# Patient Record
Sex: Male | Born: 1960 | Race: White | Hispanic: No | Marital: Single | Smoking: Former smoker
Health system: Southern US, Community
[De-identification: ages and names within clinical notes are randomized; demographics above are authoritative.]

## PROBLEM LIST (undated history)

## (undated) DIAGNOSIS — K7460 Unspecified cirrhosis of liver: Secondary | ICD-10-CM

## (undated) DIAGNOSIS — R200 Anesthesia of skin: Secondary | ICD-10-CM

## (undated) DIAGNOSIS — I471 Supraventricular tachycardia: Secondary | ICD-10-CM

## (undated) DIAGNOSIS — D638 Anemia in other chronic diseases classified elsewhere: Secondary | ICD-10-CM

## (undated) DIAGNOSIS — K219 Gastro-esophageal reflux disease without esophagitis: Secondary | ICD-10-CM

## (undated) DIAGNOSIS — E7800 Pure hypercholesterolemia, unspecified: Secondary | ICD-10-CM

## (undated) DIAGNOSIS — K859 Acute pancreatitis without necrosis or infection, unspecified: Secondary | ICD-10-CM

## (undated) DIAGNOSIS — D696 Thrombocytopenia, unspecified: Secondary | ICD-10-CM

## (undated) DIAGNOSIS — I10 Essential (primary) hypertension: Secondary | ICD-10-CM

## (undated) DIAGNOSIS — K7290 Hepatic failure, unspecified without coma: Secondary | ICD-10-CM

## (undated) DIAGNOSIS — F1910 Other psychoactive substance abuse, uncomplicated: Secondary | ICD-10-CM

## (undated) DIAGNOSIS — I4719 Other supraventricular tachycardia: Secondary | ICD-10-CM

## (undated) DIAGNOSIS — F419 Anxiety disorder, unspecified: Secondary | ICD-10-CM

## (undated) HISTORY — PX: NASAL RECONSTRUCTION WITH SEPTAL REPAIR: SHX5665

---

## 2012-02-13 ENCOUNTER — Emergency Department: Payer: Self-pay | Admitting: Emergency Medicine

## 2013-10-01 ENCOUNTER — Ambulatory Visit: Payer: Self-pay | Admitting: Gastroenterology

## 2013-10-02 LAB — PATHOLOGY REPORT

## 2015-04-02 ENCOUNTER — Encounter: Payer: Self-pay | Admitting: *Deleted

## 2015-04-02 ENCOUNTER — Emergency Department: Payer: No Typology Code available for payment source

## 2015-04-02 ENCOUNTER — Emergency Department
Admission: EM | Admit: 2015-04-02 | Discharge: 2015-04-02 | Disposition: A | Discharge status: Home / Self Care | Payer: No Typology Code available for payment source | Attending: Emergency Medicine | Admitting: Emergency Medicine

## 2015-04-02 DIAGNOSIS — R42 Dizziness and giddiness: Secondary | ICD-10-CM | POA: Diagnosis not present

## 2015-04-02 DIAGNOSIS — F419 Anxiety disorder, unspecified: Secondary | ICD-10-CM | POA: Diagnosis not present

## 2015-04-02 DIAGNOSIS — J209 Acute bronchitis, unspecified: Secondary | ICD-10-CM | POA: Diagnosis not present

## 2015-04-02 DIAGNOSIS — R05 Cough: Secondary | ICD-10-CM | POA: Diagnosis present

## 2015-04-02 DIAGNOSIS — I10 Essential (primary) hypertension: Secondary | ICD-10-CM | POA: Diagnosis not present

## 2015-04-02 DIAGNOSIS — J40 Bronchitis, not specified as acute or chronic: Secondary | ICD-10-CM

## 2015-04-02 HISTORY — DX: Anxiety disorder, unspecified: F41.9

## 2015-04-02 HISTORY — DX: Essential (primary) hypertension: I10

## 2015-04-02 HISTORY — DX: Pure hypercholesterolemia, unspecified: E78.00

## 2015-04-02 LAB — COMPREHENSIVE METABOLIC PANEL
ALT: 137 U/L — ABNORMAL HIGH (ref 17–63)
AST: 224 U/L — ABNORMAL HIGH (ref 15–41)
Albumin: 4.4 g/dL (ref 3.5–5.0)
Alkaline Phosphatase: 128 U/L — ABNORMAL HIGH (ref 38–126)
Anion gap: 9 (ref 5–15)
BILIRUBIN TOTAL: 1 mg/dL (ref 0.3–1.2)
BUN: 20 mg/dL (ref 6–20)
CO2: 24 mmol/L (ref 22–32)
CREATININE: 1.81 mg/dL — AB (ref 0.61–1.24)
Calcium: 9.2 mg/dL (ref 8.9–10.3)
Chloride: 97 mmol/L — ABNORMAL LOW (ref 101–111)
GFR calc non Af Amer: 40 mL/min — ABNORMAL LOW (ref >60)
GFR, EST AFRICAN AMERICAN: 47 mL/min — AB (ref >60)
GLUCOSE: 113 mg/dL — AB (ref 65–99)
POTASSIUM: 3.8 mmol/L (ref 3.5–5.1)
Sodium: 130 mmol/L — ABNORMAL LOW (ref 135–145)
TOTAL PROTEIN: 8.9 g/dL — AB (ref 6.5–8.1)

## 2015-04-02 LAB — CBC WITH DIFFERENTIAL/PLATELET
Basophils Absolute: 0.1 10*3/uL (ref 0–0.1)
Basophils Relative: 1 %
EOS PCT: 3 %
Eosinophils Absolute: 0.3 10*3/uL (ref 0–0.7)
HEMATOCRIT: 44.9 % (ref 40.0–52.0)
Hemoglobin: 15.2 g/dL (ref 13.0–18.0)
LYMPHS PCT: 17 %
Lymphs Abs: 1.6 10*3/uL (ref 1.0–3.6)
MCH: 34.3 pg — AB (ref 26.0–34.0)
MCHC: 33.9 g/dL (ref 32.0–36.0)
MCV: 101.2 fL — AB (ref 80.0–100.0)
Monocytes Absolute: 0.8 10*3/uL (ref 0.2–1.0)
Monocytes Relative: 9 %
NEUTROS ABS: 6.7 10*3/uL — AB (ref 1.4–6.5)
Neutrophils Relative %: 70 %
PLATELETS: 268 10*3/uL (ref 150–440)
RBC: 4.44 MIL/uL (ref 4.40–5.90)
RDW: 13.3 % (ref 11.5–14.5)
WBC: 9.5 10*3/uL (ref 3.8–10.6)

## 2015-04-02 LAB — TROPONIN I: Troponin I: 0.05 ng/mL — ABNORMAL HIGH (ref <0.031)

## 2015-04-02 MED ORDER — PREDNISONE 50 MG PO TABS: 50.0000 mg | ORAL_TABLET | Freq: Every day | ORAL | Status: DC

## 2015-04-02 MED ORDER — AZITHROMYCIN 250 MG PO TABS: ORAL_TABLET | ORAL | Status: AC

## 2015-04-02 MED ORDER — SODIUM CHLORIDE 0.9 % IV SOLN
1000.0000 mL | Freq: Once | INTRAVENOUS | Status: AC
  Administered 2015-04-02: 1000 mL via INTRAVENOUS

## 2015-04-02 NOTE — ED Provider Notes (Signed)
[facility] Emergency Department Provider Note  ____________________________________________  Time seen: 8 PM  I have reviewed the triage vital signs and the nursing notes.   HISTORY  Chief Complaint Cough for 4 months and intermittent dizziness   HPI Cristine PolioMichael J Kishbaugh is a 54 y.o. male who presents with complaints of cough for the last 4 months. He is unsure what is causing it. He does not smoke, he does smoke in the past. No history of COPD. He hasn't had fevers or chills. His cough is nonproductive. He denies chest pain. No pleurisy No recent travel. No calf pain or swelling. No history of DVT.No hemoptysis. He has had intermittent episodes of dizziness as well but is usually related to standing up quickly in the morning and occasionally after a hard bout of coughing. Nurse's note mentions exertional shortness of breath but he denies this to me. No recent incarceration.     Past Medical History  Diagnosis Date  . Hypertension   . Hypercholesteremia   . Anxiety     There are no active problems to display for this patient.   Past Surgical History  Procedure Laterality Date  . Nasal reconstruction with septal repair      No current outpatient prescriptions on file.  Allergies Review of patient's allergies indicates no known allergies.  No family history on file.  Social History Social History  Substance Use Topics  . Smoking status: Never Smoker   . Smokeless tobacco: None  . Alcohol Use: Yes    Review of Systems  Constitutional: Negative for fever. Negative for chills Eyes: Negative for visual changes. ENT: Negative for sore throat Cardiovascular: Negative for chest pain. Respiratory: Negative for shortness of breath. As above for coughing Gastrointestinal: Negative for abdominal pain  Musculoskeletal: Negative for back pain. Skin: Negative for rash. Neurological: Negative for headaches or focal weakness Psychiatric: Positive for  anxiety    ____________________________________________   PHYSICAL EXAM:  VITAL SIGNS: ED Triage Vitals  Enc Vitals Group     BP 04/02/15 1833 101/66 mmHg     Pulse Rate 04/02/15 1833 109     Resp 04/02/15 1833 20     Temp 04/02/15 1833 98.4 F (36.9 C)     Temp Source 04/02/15 1833 Oral     SpO2 04/02/15 1833 99 %     Weight 04/02/15 1833 200 lb (90.719 kg)     Height 04/02/15 1833 6' (1.829 m)     Head Cir --      Peak Flow --      Pain Score 04/02/15 1835 9     Pain Loc --      Pain Edu? --      Excl. in GC? --      Constitutional: Alert and oriented. Well appearing and in no distress. Eyes: Conjunctivae are normal.  ENT   Head: Normocephalic and atraumatic.   Mouth/Throat: Mucous membranes are moist. Cardiovascular: Normal rate, regular rhythm. Normal and symmetric distal pulses are present in all extremities. No murmurs, rubs, or gallops. Respiratory: Normal respiratory effort without tachypnea nor retractions. Scattered wheezes Gastrointestinal: Soft and non-tender in all quadrants. No distention. There is no CVA tenderness. Genitourinary: deferred Musculoskeletal: Nontender with normal range of motion in all extremities. No lower extremity tenderness nor edema. Neurologic:  Normal speech and language. No gross focal neurologic deficits are appreciated. Skin:  Skin is warm, dry and intact. No rash noted. Psychiatric: Mood and affect are normal. Patient exhibits appropriate insight and  judgment.  ____________________________________________    LABS (pertinent positives/negatives)  Labs Reviewed  TROPONIN I - Abnormal; Notable for the following:    Troponin I 0.05 (*)    All other components within normal limits  CBC WITH DIFFERENTIAL/PLATELET - Abnormal; Notable for the following:    MCV 101.2 (*)    MCH 34.3 (*)    Neutro Abs 6.7 (*)    All other components within normal limits  COMPREHENSIVE METABOLIC PANEL - Abnormal; Notable for the following:     Sodium 130 (*)    Chloride 97 (*)    Glucose, Bld 113 (*)    Creatinine, Ser 1.81 (*)    Total Protein 8.9 (*)    AST 224 (*)    ALT 137 (*)    Alkaline Phosphatase 128 (*)    GFR calc non Af Amer 40 (*)    GFR calc Af Amer 47 (*)    All other components within normal limits  TROPONIN I    ____________________________________________   EKG  ED ECG REPORT I, Jene Every, the attending physician, personally viewed and interpreted this ECG.  Date: 04/02/2015 EKG Time: 6:44 PM Rate: 99 Rhythm: normal sinus rhythm QRS Axis: normal Intervals: normal ST/T Wave abnormalities: normal Conduction Disutrbances: none Narrative Interpretation: unremarkable   ____________________________________________    RADIOLOGY I have personally reviewed any xrays that were ordered on this patient: Chest x-ray unremarkable  ____________________________________________   PROCEDURES  Procedure(s) performed: none  Critical Care performed: none  ____________________________________________   INITIAL IMPRESSION / ASSESSMENT AND PLAN / ED COURSE  Pertinent labs & imaging results that were available during my care of the patient were reviewed by me and considered in my medical decision making (see chart for details).  Patient well-appearing with symptoms over the last 4 months. Chronic cough suspicious for bronchitis given scattered wheezes and history of present illness. Pulmonary embolus considered although no pleurisy no hemoptysis no history of DVTs, no calf pain, no chest pain so felt to be extremely unlikely. EKG completely normal. Troponin rechecked and normalized. History of present illness not consistent with ACS. No dizziness in the last week   I'll discharge with a prednisone Z-Pak for bronchitis. I have asked patient to follow-up with his PCP this week for further evaluation. Return precautions discussed ____________________________________________   FINAL CLINICAL  IMPRESSION(S) / ED DIAGNOSES  Final diagnoses:  Bronchitis     Jene Every, MD 04/02/15 2139

## 2015-04-02 NOTE — ED Notes (Addendum)
Pt reports non-productive cough for "weeks". Pt also reports taking OTC cold mediations w/o relief.    04/02/15 2020  Respiratory  Respiratory (WDL) X  Bilateral Breath Sounds Clear  O2 Device Room Air

## 2015-04-02 NOTE — ED Notes (Signed)
MD at bedside. 

## 2015-04-02 NOTE — Discharge Instructions (Signed)

## 2015-04-02 NOTE — ED Notes (Addendum)
Pt c/o headache and pain across shoulder blades and in chest with coughing. Pt reports non-productive cough with dizziness.    04/02/15 2020  Musculoskeletal  Musculoskeletal (WDL) X

## 2015-04-02 NOTE — ED Notes (Signed)
Past few weeks, has had dry cough that causes him some dizziness, anytype of activity makes hime feel like he is going to pass out

## 2015-07-01 ENCOUNTER — Emergency Department
Admission: EM | Admit: 2015-07-01 | Discharge: 2015-07-01 | Disposition: A | Discharge status: Home / Self Care | Payer: Self-pay | Attending: Emergency Medicine | Admitting: Emergency Medicine

## 2015-07-01 ENCOUNTER — Encounter: Payer: Self-pay | Admitting: Emergency Medicine

## 2015-07-01 ENCOUNTER — Emergency Department: Payer: Self-pay

## 2015-07-01 DIAGNOSIS — Z7952 Long term (current) use of systemic steroids: Secondary | ICD-10-CM | POA: Insufficient documentation

## 2015-07-01 DIAGNOSIS — E7800 Pure hypercholesterolemia, unspecified: Secondary | ICD-10-CM | POA: Insufficient documentation

## 2015-07-01 DIAGNOSIS — J069 Acute upper respiratory infection, unspecified: Secondary | ICD-10-CM | POA: Insufficient documentation

## 2015-07-01 DIAGNOSIS — I10 Essential (primary) hypertension: Secondary | ICD-10-CM | POA: Insufficient documentation

## 2015-07-01 LAB — CBC WITH DIFFERENTIAL/PLATELET
BASOS ABS: 0.1 10*3/uL (ref 0–0.1)
Basophils Relative: 1 %
Eosinophils Absolute: 0.1 10*3/uL (ref 0–0.7)
Eosinophils Relative: 0 %
HEMATOCRIT: 34.6 % — AB (ref 40.0–52.0)
Hemoglobin: 11.9 g/dL — ABNORMAL LOW (ref 13.0–18.0)
LYMPHS ABS: 1.6 10*3/uL (ref 1.0–3.6)
LYMPHS PCT: 12 %
MCH: 35.5 pg — AB (ref 26.0–34.0)
MCHC: 34.5 g/dL (ref 32.0–36.0)
MCV: 102.9 fL — AB (ref 80.0–100.0)
MONO ABS: 0.8 10*3/uL (ref 0.2–1.0)
MONOS PCT: 6 %
NEUTROS ABS: 10.6 10*3/uL — AB (ref 1.4–6.5)
Neutrophils Relative %: 81 %
Platelets: 141 10*3/uL — ABNORMAL LOW (ref 150–440)
RBC: 3.36 MIL/uL — ABNORMAL LOW (ref 4.40–5.90)
RDW: 13.9 % (ref 11.5–14.5)
WBC: 13.2 10*3/uL — ABNORMAL HIGH (ref 3.8–10.6)

## 2015-07-01 LAB — BASIC METABOLIC PANEL
ANION GAP: 8 (ref 5–15)
BUN: 9 mg/dL (ref 6–20)
CHLORIDE: 104 mmol/L (ref 101–111)
CO2: 23 mmol/L (ref 22–32)
Calcium: 7.7 mg/dL — ABNORMAL LOW (ref 8.9–10.3)
Creatinine, Ser: 0.84 mg/dL (ref 0.61–1.24)
GFR calc Af Amer: >60 mL/min (ref >60)
GLUCOSE: 105 mg/dL — AB (ref 65–99)
POTASSIUM: 3.3 mmol/L — AB (ref 3.5–5.1)
Sodium: 135 mmol/L (ref 135–145)

## 2015-07-01 MED ORDER — DIPHENOXYLATE-ATROPINE 2.5-0.025 MG PO TABS: 1.0000 | ORAL_TABLET | Freq: Four times a day (QID) | ORAL | Status: DC | PRN

## 2015-07-01 MED ORDER — GUAIFENESIN-CODEINE 100-10 MG/5ML PO SOLN: 10.0000 mL | ORAL | Status: DC | PRN

## 2015-07-01 MED ORDER — AZITHROMYCIN 250 MG PO TABS: ORAL_TABLET | ORAL | Status: DC

## 2015-07-01 MED ORDER — ONDANSETRON HCL 4 MG PO TABS: 4.0000 mg | ORAL_TABLET | Freq: Every day | ORAL | Status: DC | PRN

## 2015-07-01 NOTE — ED Notes (Signed)
Pt to ed with c/o cough, body aches, congestion and nasal drainage, mild diarrhea and vomiting after coughing for several days.  PA at bedside at this time. Pt states poor intake since Sunday.  Pt in no acute respiratory distress at this time.

## 2015-07-01 NOTE — ED Notes (Signed)
Pt reports cough/congestion, "ear popping",N/V/D.

## 2015-07-01 NOTE — ED Provider Notes (Signed)
[facility] Emergency Department Provider Note  ____________________________________________  Time seen: Approximately 2:13 PM  I have reviewed the triage vital signs and the nursing notes.   HISTORY  Chief Complaint Influenza    HPI Ryan Howe is a 55 y.o. male since for evaluation cough congestion and body aches nasal drainage [place] vomiting after coughing for several days. Patient states that he's had a fever but nonetheless couple days. She reports some "ear popping" in his right ear. Patient reports symptoms gradual in onset currently afebrile. Has not vomited or had diarrhea since arriving.   Past Medical History  Diagnosis Date  . Hypertension   . Hypercholesteremia   . Anxiety     There are no active problems to display for this patient.   Past Surgical History  Procedure Laterality Date  . Nasal reconstruction with septal repair      Current Outpatient Rx  Name  Route  Sig  Dispense  Refill  . azithromycin (ZITHROMAX Z-PAK) 250 MG tablet      Take 2 tablets (500 mg) on  Day 1,  followed by 1 tablet (250 mg) once daily on Days 2 through 5.   6 each   0   . diphenoxylate-atropine (LOMOTIL) 2.5-0.025 MG tablet   Oral   Take 1 tablet by mouth 4 (four) times daily as needed for diarrhea or loose stools.   12 tablet   0   . guaiFENesin-codeine 100-10 MG/5ML syrup   Oral   Take 10 mLs by mouth every 4 (four) hours as needed for cough.   180 mL   0   . ondansetron (ZOFRAN) 4 MG tablet   Oral   Take 1 tablet (4 mg total) by mouth daily as needed for nausea or vomiting.   12 tablet   0   . predniSONE (DELTASONE) 50 MG tablet   Oral   Take 1 tablet (50 mg total) by mouth daily with breakfast.   5 tablet   0     Allergies Review of patient's allergies indicates no known allergies.  No family history on file.  Social History Social History  Substance Use Topics  . Smoking status: Never Smoker   . Smokeless  tobacco: None  . Alcohol Use: Yes    Review of Systems Constitutional: Occasional fever/chills Eyes: No visual changes. ENT: No sore throat. Cardiovascular: Denies chest pain. Respiratory: Denies shortness of breath. Positive for coughing  Gastrointestinal: Minimal abdominal pain.  Positive for nausea, vomiting, diarrhea..  No constipation. Genitourinary: Negative for dysuria. Musculoskeletal: Negative for back pain. Skin: Negative for rash. Neurological: Negative for headaches, focal weakness or numbness.  10-point ROS otherwise negative.  ____________________________________________   PHYSICAL EXAM:  VITAL SIGNS: ED Triage Vitals  Enc Vitals Group     BP 07/01/15 1357 155/92 mmHg     Pulse Rate 07/01/15 1356 92     Resp 07/01/15 1356 16     Temp 07/01/15 1356 98.2 F (36.8 C)     Temp Source 07/01/15 1356 Oral     SpO2 07/01/15 1356 99 %     Weight 07/01/15 1356 200 lb (90.719 kg)     Height 07/01/15 1356 6' (1.829 m)     Head Cir --      Peak Flow --      Pain Score 07/01/15 1356 8     Pain Loc --      Pain Edu? --      Excl. in GC? --  Constitutional: Alert and oriented. Well appearing and in no acute distress. Eyes: Conjunctivae are normal. PERRL. EOMI. Head: Atraumatic. TM with minimal cerumen noted in the right ear Nose: No congestion/rhinnorhea. Mouth/Throat: Mucous membranes are moist.  Oropharynx non-erythematous. Neck: No stridor.   Cardiovascular: Normal rate, regular rhythm. Grossly normal heart sounds.  Good peripheral circulation. Respiratory: Normal respiratory effort.  No retractions. Lungs CTAB. Gastrointestinal: Soft and nontender. No distention. No abdominal bruits. No CVA tenderness. Musculoskeletal: No lower extremity tenderness nor edema.  No joint effusions. Neurologic:  Normal speech and language. No gross focal neurologic deficits are appreciated. No gait instability. Skin:  Skin is warm, dry and intact. No rash noted. Psychiatric:  Mood and affect are normal. Speech and behavior are normal.  ____________________________________________   LABS (all labs ordered are listed, but only abnormal results are displayed)  Labs Reviewed  BASIC METABOLIC PANEL - Abnormal; Notable for the following:    Potassium 3.3 (*)    Glucose, Bld 105 (*)    Calcium 7.7 (*)    All other components within normal limits  CBC WITH DIFFERENTIAL/PLATELET - Abnormal; Notable for the following:    WBC 13.2 (*)    RBC 3.36 (*)    Hemoglobin 11.9 (*)    HCT 34.6 (*)    MCV 102.9 (*)    MCH 35.5 (*)    Platelets 141 (*)    Neutro Abs 10.6 (*)    All other components within normal limits     RADIOLOGY  Negative for acute abdomen and chest x-ray negative. ____________________________________________   PROCEDURES  Procedure(s) performed: None  Critical Care performed: No  ____________________________________________   INITIAL IMPRESSION / ASSESSMENT AND PLAN / ED COURSE  Pertinent labs & imaging results that were available during my care of the patient were reviewed by me and considered in my medical decision making (see chart for details).  Acute URI. Rx given for Zithromax and Robitussin-AC. Work excuse given. Patient to follow-up with PCP or return here for any worsening symptomology. ____________________________________________   FINAL CLINICAL IMPRESSION(S) / ED DIAGNOSES  Final diagnoses:  URI, acute     This chart was dictated using voice recognition software/Dragon. Despite best efforts to proofread, errors can occur which can change the meaning. Any change was purely unintentional.   Evangeline Dakin, PA-C 07/01/15 1609  Rockne Menghini, MD 07/02/15 [phone]

## 2015-07-01 NOTE — Discharge Instructions (Signed)
Upper Respiratory Infection, Adult Most upper respiratory infections (URIs) are a viral infection of the air passages leading to the lungs. A URI affects the nose, throat, and upper air passages. The most common type of URI is nasopharyngitis and is typically referred to as "the common cold." URIs run their course and usually go away on their own. Most of the time, a URI does not require medical attention, but sometimes a bacterial infection in the upper airways can follow a viral infection. This is called a secondary infection. Sinus and middle ear infections are common types of secondary upper respiratory infections. Bacterial pneumonia can also complicate a URI. A URI can worsen asthma and chronic obstructive pulmonary disease (COPD). Sometimes, these complications can require emergency medical care and may be life threatening.  CAUSES Almost all URIs are caused by viruses. A virus is a type of germ and can spread from one person to another.  RISKS FACTORS You may be at risk for a URI if:   You smoke.   You have chronic heart or lung disease.  You have a weakened defense (immune) system.   You are very young or very old.   You have nasal allergies or asthma.  You work in crowded or poorly ventilated areas.  You work in health care facilities or schools. SIGNS AND SYMPTOMS  Symptoms typically develop 2-3 days after you come in contact with a cold virus. Most viral URIs last 7-10 days. However, viral URIs from the influenza virus (flu virus) can last 14-18 days and are typically more severe. Symptoms may include:   Runny or stuffy (congested) nose.   Sneezing.   Cough.   Sore throat.   Headache.   Fatigue.   Fever.   Loss of appetite.   Pain in your forehead, behind your eyes, and over your cheekbones (sinus pain).  Muscle aches.  DIAGNOSIS  Your health care provider may diagnose a URI by:  Physical exam.  Tests to check that your symptoms are not due to  another condition such as:  Strep throat.  Sinusitis.  Pneumonia.  Asthma. TREATMENT  A URI goes away on its own with time. It cannot be cured with medicines, but medicines may be prescribed or recommended to relieve symptoms. Medicines may help:  Reduce your fever.  Reduce your cough.  Relieve nasal congestion. HOME CARE INSTRUCTIONS   Take medicines only as directed by your health care provider.   Gargle warm saltwater or take cough drops to comfort your throat as directed by your health care provider.  Use a warm mist humidifier or inhale steam from a shower to increase air moisture. This may make it easier to breathe.  Drink enough fluid to keep your urine clear or pale yellow.   Eat soups and other clear broths and maintain good nutrition.   Rest as needed.   Return to work when your temperature has returned to normal or as your health care provider advises. You may need to stay home longer to avoid infecting others. You can also use a face mask and careful hand washing to prevent spread of the virus.  Increase the usage of your inhaler if you have asthma.   Do not use any tobacco products, including cigarettes, chewing tobacco, or electronic cigarettes. If you need help quitting, ask your health care provider. PREVENTION  The best way to protect yourself from getting a cold is to practice good hygiene.   Avoid oral or hand contact with people with cold   symptoms.   Wash your hands often if contact occurs.  There is no clear evidence that vitamin C, vitamin E, echinacea, or exercise reduces the chance of developing a cold. However, it is always recommended to get plenty of rest, exercise, and practice good nutrition.  SEEK MEDICAL CARE IF:   You are getting worse rather than better.   Your symptoms are not controlled by medicine.   You have chills.  You have worsening shortness of breath.  You have brown or red mucus.  You have yellow or brown nasal  discharge.  You have pain in your face, especially when you bend forward.  You have a fever.  You have swollen neck glands.  You have pain while swallowing.  You have white areas in the back of your throat. SEEK IMMEDIATE MEDICAL CARE IF:   You have severe or persistent:  Headache.  Ear pain.  Sinus pain.  Chest pain.  You have chronic lung disease and any of the following:  Wheezing.  Prolonged cough.  Coughing up blood.  A change in your usual mucus.  You have a stiff neck.  You have changes in your:  Vision.  Hearing.  Thinking.  Mood. MAKE SURE YOU:   Understand these instructions.  Will watch your condition.  Will get help right away if you are not doing well or get worse.   This information is not intended to replace advice given to you by your health care provider. Make sure you discuss any questions you have with your health care provider.   Document Released: 09/15/2000 Document Revised: 08/06/2014 Document Reviewed: 06/27/2013 [employer] Interactive Patient Education 2016 [employer]  

## 2015-07-30 ENCOUNTER — Ambulatory Visit: Payer: No Typology Code available for payment source

## 2015-12-02 DIAGNOSIS — K625 Hemorrhage of anus and rectum: Secondary | ICD-10-CM | POA: Insufficient documentation

## 2015-12-22 ENCOUNTER — Emergency Department: Payer: Self-pay

## 2015-12-22 ENCOUNTER — Encounter: Payer: Self-pay | Admitting: Emergency Medicine

## 2015-12-22 ENCOUNTER — Inpatient Hospital Stay
Admission: EM | Admit: 2015-12-22 | Discharge: 2015-12-26 | DRG: 433 | Disposition: A | Discharge status: Home / Self Care | Payer: Self-pay | Attending: Internal Medicine | Admitting: Internal Medicine

## 2015-12-22 DIAGNOSIS — E860 Dehydration: Secondary | ICD-10-CM | POA: Diagnosis present

## 2015-12-22 DIAGNOSIS — K7030 Alcoholic cirrhosis of liver without ascites: Secondary | ICD-10-CM | POA: Diagnosis present

## 2015-12-22 DIAGNOSIS — K7682 Hepatic encephalopathy: Secondary | ICD-10-CM

## 2015-12-22 DIAGNOSIS — R748 Abnormal levels of other serum enzymes: Secondary | ICD-10-CM | POA: Diagnosis present

## 2015-12-22 DIAGNOSIS — D638 Anemia in other chronic diseases classified elsewhere: Secondary | ICD-10-CM | POA: Diagnosis present

## 2015-12-22 DIAGNOSIS — K759 Inflammatory liver disease, unspecified: Secondary | ICD-10-CM

## 2015-12-22 DIAGNOSIS — K7010 Alcoholic hepatitis without ascites: Principal | ICD-10-CM | POA: Diagnosis present

## 2015-12-22 DIAGNOSIS — N179 Acute kidney failure, unspecified: Secondary | ICD-10-CM | POA: Diagnosis present

## 2015-12-22 DIAGNOSIS — K760 Fatty (change of) liver, not elsewhere classified: Secondary | ICD-10-CM | POA: Diagnosis present

## 2015-12-22 DIAGNOSIS — R2681 Unsteadiness on feet: Secondary | ICD-10-CM

## 2015-12-22 DIAGNOSIS — R17 Unspecified jaundice: Secondary | ICD-10-CM

## 2015-12-22 DIAGNOSIS — Z8249 Family history of ischemic heart disease and other diseases of the circulatory system: Secondary | ICD-10-CM

## 2015-12-22 DIAGNOSIS — Z9889 Other specified postprocedural states: Secondary | ICD-10-CM

## 2015-12-22 DIAGNOSIS — F10239 Alcohol dependence with withdrawal, unspecified: Secondary | ICD-10-CM | POA: Diagnosis present

## 2015-12-22 DIAGNOSIS — I10 Essential (primary) hypertension: Secondary | ICD-10-CM | POA: Diagnosis present

## 2015-12-22 DIAGNOSIS — K7200 Acute and subacute hepatic failure without coma: Secondary | ICD-10-CM | POA: Diagnosis present

## 2015-12-22 DIAGNOSIS — Z79899 Other long term (current) drug therapy: Secondary | ICD-10-CM

## 2015-12-22 DIAGNOSIS — D6959 Other secondary thrombocytopenia: Secondary | ICD-10-CM | POA: Diagnosis present

## 2015-12-22 DIAGNOSIS — R569 Unspecified convulsions: Secondary | ICD-10-CM | POA: Diagnosis present

## 2015-12-22 DIAGNOSIS — K7290 Hepatic failure, unspecified without coma: Secondary | ICD-10-CM | POA: Diagnosis present

## 2015-12-22 DIAGNOSIS — E785 Hyperlipidemia, unspecified: Secondary | ICD-10-CM | POA: Diagnosis present

## 2015-12-22 DIAGNOSIS — D689 Coagulation defect, unspecified: Secondary | ICD-10-CM | POA: Diagnosis present

## 2015-12-22 LAB — COMPREHENSIVE METABOLIC PANEL
ALBUMIN: 4.5 g/dL (ref 3.5–5.0)
ALT: 38 U/L (ref 17–63)
ANION GAP: 17 — AB (ref 5–15)
AST: 143 U/L — ABNORMAL HIGH (ref 15–41)
Alkaline Phosphatase: 223 U/L — ABNORMAL HIGH (ref 38–126)
BUN: 17 mg/dL (ref 6–20)
CHLORIDE: 102 mmol/L (ref 101–111)
CO2: 21 mmol/L — AB (ref 22–32)
CREATININE: 1.51 mg/dL — AB (ref 0.61–1.24)
Calcium: 9.7 mg/dL (ref 8.9–10.3)
GFR calc Af Amer: 58 mL/min — ABNORMAL LOW (ref >60)
GFR calc non Af Amer: 50 mL/min — ABNORMAL LOW (ref >60)
GLUCOSE: 167 mg/dL — AB (ref 65–99)
POTASSIUM: 4.7 mmol/L (ref 3.5–5.1)
SODIUM: 140 mmol/L (ref 135–145)
TOTAL PROTEIN: 9.3 g/dL — AB (ref 6.5–8.1)
Total Bilirubin: 4.5 mg/dL — ABNORMAL HIGH (ref 0.3–1.2)

## 2015-12-22 LAB — AMMONIA: Ammonia: 45 umol/L — ABNORMAL HIGH (ref 9–35)

## 2015-12-22 LAB — CBC WITH DIFFERENTIAL/PLATELET
Basophils Absolute: 0 10*3/uL (ref 0–0.1)
EOS ABS: 0 10*3/uL (ref 0–0.7)
HCT: 31.6 % — ABNORMAL LOW (ref 40.0–52.0)
HEMOGLOBIN: 11 g/dL — AB (ref 13.0–18.0)
Lymphocytes Relative: 9 %
Lymphs Abs: 0.4 10*3/uL — ABNORMAL LOW (ref 1.0–3.6)
MCH: 32.2 pg (ref 26.0–34.0)
MCHC: 34.7 g/dL (ref 32.0–36.0)
MCV: 93 fL (ref 80.0–100.0)
MONO ABS: 0.3 10*3/uL (ref 0.2–1.0)
Neutro Abs: 4.2 10*3/uL (ref 1.4–6.5)
PLATELETS: 67 10*3/uL — AB (ref 150–440)
RBC: 3.4 MIL/uL — ABNORMAL LOW (ref 4.40–5.90)
RDW: 14.7 % — AB (ref 11.5–14.5)
WBC: 5 10*3/uL (ref 3.8–10.6)

## 2015-12-22 LAB — PROTIME-INR
INR: 1.38
Prothrombin Time: 17.1 seconds — ABNORMAL HIGH (ref 11.4–15.2)

## 2015-12-22 LAB — TROPONIN I

## 2015-12-22 MED ORDER — ACETAMINOPHEN 325 MG PO TABS: 650.0000 mg | ORAL_TABLET | Freq: Four times a day (QID) | ORAL | Status: DC | PRN

## 2015-12-22 MED ORDER — ONDANSETRON HCL 4 MG/2ML IJ SOLN: 4.0000 mg | Freq: Four times a day (QID) | INTRAMUSCULAR | Status: DC | PRN

## 2015-12-22 MED ORDER — OXYCODONE HCL 5 MG PO TABS
5.0000 mg | ORAL_TABLET | ORAL | Status: DC | PRN
  Administered 2015-12-26: 06:00:00 5 mg via ORAL
  Filled 2015-12-22: qty 1

## 2015-12-22 MED ORDER — LACTULOSE 10 GM/15ML PO SOLN
30.0000 g | Freq: Once | ORAL | Status: AC
  Administered 2015-12-22: 30 g via ORAL
  Filled 2015-12-22: qty 60

## 2015-12-22 MED ORDER — SODIUM CHLORIDE 0.9% FLUSH
3.0000 mL | Freq: Two times a day (BID) | INTRAVENOUS | Status: DC
  Administered 2015-12-22 – 2015-12-26 (×6): 3 mL via INTRAVENOUS

## 2015-12-22 MED ORDER — LORAZEPAM 2 MG/ML IJ SOLN
1.0000 mg | Freq: Four times a day (QID) | INTRAMUSCULAR | Status: AC | PRN
  Administered 2015-12-23: 1 mg via INTRAVENOUS
  Filled 2015-12-22: qty 1

## 2015-12-22 MED ORDER — FOLIC ACID 1 MG PO TABS
1.0000 mg | ORAL_TABLET | Freq: Once | ORAL | Status: AC
  Administered 2015-12-22: 1 mg via ORAL
  Filled 2015-12-22: qty 1

## 2015-12-22 MED ORDER — METOPROLOL TARTRATE 25 MG PO TABS
25.0000 mg | ORAL_TABLET | Freq: Two times a day (BID) | ORAL | Status: DC
  Administered 2015-12-22 – 2015-12-26 (×8): 25 mg via ORAL
  Filled 2015-12-22 (×8): qty 1

## 2015-12-22 MED ORDER — LORAZEPAM 1 MG PO TABS
1.0000 mg | ORAL_TABLET | Freq: Four times a day (QID) | ORAL | Status: AC | PRN
  Administered 2015-12-22 – 2015-12-25 (×2): 1 mg via ORAL
  Filled 2015-12-22 (×4): qty 1

## 2015-12-22 MED ORDER — FOLIC ACID 1 MG PO TABS
1.0000 mg | ORAL_TABLET | Freq: Every day | ORAL | Status: DC
  Administered 2015-12-23 – 2015-12-26 (×4): 1 mg via ORAL
  Filled 2015-12-22 (×4): qty 1

## 2015-12-22 MED ORDER — ACETAMINOPHEN 650 MG RE SUPP: 650.0000 mg | Freq: Four times a day (QID) | RECTAL | Status: DC | PRN

## 2015-12-22 MED ORDER — RIFAXIMIN 550 MG PO TABS
550.0000 mg | ORAL_TABLET | Freq: Two times a day (BID) | ORAL | Status: DC
  Administered 2015-12-22 – 2015-12-26 (×8): 550 mg via ORAL
  Filled 2015-12-22 (×8): qty 1

## 2015-12-22 MED ORDER — SODIUM CHLORIDE 0.9 % IV BOLUS (SEPSIS)
500.0000 mL | Freq: Once | INTRAVENOUS | Status: AC
  Administered 2015-12-22: 500 mL via INTRAVENOUS

## 2015-12-22 MED ORDER — CITALOPRAM HYDROBROMIDE 20 MG PO TABS
10.0000 mg | ORAL_TABLET | Freq: Every day | ORAL | Status: DC
Start: 2015-12-22 — End: 2015-12-26
  Administered 2015-12-22 – 2015-12-25 (×4): 10 mg via ORAL
  Filled 2015-12-22 (×4): qty 1

## 2015-12-22 MED ORDER — POTASSIUM CHLORIDE IN NACL 20-0.9 MEQ/L-% IV SOLN
INTRAVENOUS | Status: DC
  Administered 2015-12-22: 23:00:00 via INTRAVENOUS
  Filled 2015-12-22 (×3): qty 1000

## 2015-12-22 MED ORDER — SODIUM CHLORIDE 0.9 % IV BOLUS (SEPSIS)
1000.0000 mL | Freq: Once | INTRAVENOUS | Status: AC
  Administered 2015-12-22: 1000 mL via INTRAVENOUS

## 2015-12-22 MED ORDER — LORAZEPAM 2 MG/ML IJ SOLN
2.0000 mg | Freq: Once | INTRAMUSCULAR | Status: AC
  Administered 2015-12-22: 2 mg via INTRAVENOUS
  Filled 2015-12-22: qty 1

## 2015-12-22 MED ORDER — VITAMIN B-1 100 MG PO TABS
100.0000 mg | ORAL_TABLET | Freq: Every day | ORAL | Status: DC
  Administered 2015-12-23 – 2015-12-26 (×4): 100 mg via ORAL
  Filled 2015-12-22 (×4): qty 1

## 2015-12-22 MED ORDER — THIAMINE HCL 100 MG/ML IJ SOLN: 100.0000 mg | Freq: Once | INTRAMUSCULAR | Status: DC

## 2015-12-22 MED ORDER — ADULT MULTIVITAMIN W/MINERALS CH
1.0000 | ORAL_TABLET | Freq: Every day | ORAL | Status: DC
  Administered 2015-12-22 – 2015-12-26 (×5): 1 via ORAL
  Filled 2015-12-22 (×5): qty 1

## 2015-12-22 MED ORDER — LORAZEPAM 2 MG PO TABS
0.0000 mg | ORAL_TABLET | Freq: Four times a day (QID) | ORAL | Status: AC
Start: 2015-12-22 — End: 2015-12-24
  Administered 2015-12-22: 2 mg via ORAL
  Administered 2015-12-23: 1 mg via ORAL
  Administered 2015-12-23: 10:00:00 2 mg via ORAL
  Administered 2015-12-23 – 2015-12-24 (×4): 1 mg via ORAL
  Filled 2015-12-22 (×4): qty 1
  Filled 2015-12-22: qty 2
  Filled 2015-12-22 (×3): qty 1

## 2015-12-22 MED ORDER — LORAZEPAM 2 MG PO TABS
0.0000 mg | ORAL_TABLET | Freq: Two times a day (BID) | ORAL | Status: AC
  Administered 2015-12-22: 2 mg via ORAL
  Administered 2015-12-24 – 2015-12-25 (×2): 1 mg via ORAL
  Administered 2015-12-25: 09:00:00 2 mg via ORAL
  Filled 2015-12-22 (×2): qty 1

## 2015-12-22 MED ORDER — CHLORDIAZEPOXIDE HCL 25 MG PO CAPS
25.0000 mg | ORAL_CAPSULE | Freq: Three times a day (TID) | ORAL | Status: AC
  Administered 2015-12-23 – 2015-12-24 (×6): 25 mg via ORAL
  Filled 2015-12-22 (×7): qty 1

## 2015-12-22 MED ORDER — THIAMINE HCL 100 MG/ML IJ SOLN
100.0000 mg | Freq: Once | INTRAMUSCULAR | Status: AC
  Administered 2015-12-22: 100 mg via INTRAVENOUS
  Filled 2015-12-22: qty 2

## 2015-12-22 MED ORDER — POLYETHYLENE GLYCOL 3350 17 G PO PACK: 17.0000 g | PACK | Freq: Every day | ORAL | Status: DC | PRN

## 2015-12-22 MED ORDER — LACTULOSE 10 GM/15ML PO SOLN
30.0000 g | Freq: Two times a day (BID) | ORAL | Status: DC
  Administered 2015-12-23 – 2015-12-26 (×7): 30 g via ORAL
  Filled 2015-12-22 (×7): qty 45

## 2015-12-22 MED ORDER — LOSARTAN POTASSIUM 50 MG PO TABS
50.0000 mg | ORAL_TABLET | Freq: Every day | ORAL | Status: DC
  Administered 2015-12-23 – 2015-12-26 (×4): 50 mg via ORAL
  Filled 2015-12-22 (×4): qty 1

## 2015-12-22 MED ORDER — ALBUTEROL SULFATE (2.5 MG/3ML) 0.083% IN NEBU: 2.5000 mg | INHALATION_SOLUTION | RESPIRATORY_TRACT | Status: DC | PRN

## 2015-12-22 MED ORDER — ONDANSETRON HCL 4 MG PO TABS: 4.0000 mg | ORAL_TABLET | Freq: Four times a day (QID) | ORAL | Status: DC | PRN

## 2015-12-22 MED ORDER — PNEUMOCOCCAL VAC POLYVALENT 25 MCG/0.5ML IJ INJ
0.5000 mL | INJECTION | INTRAMUSCULAR | Status: AC
  Administered 2015-12-23: 0.5 mL via INTRAMUSCULAR
  Filled 2015-12-22: qty 0.5

## 2015-12-22 MED ORDER — INFLUENZA VAC SPLIT QUAD 0.5 ML IM SUSY
0.5000 mL | PREFILLED_SYRINGE | INTRAMUSCULAR | Status: AC
  Administered 2015-12-23: 0.5 mL via INTRAMUSCULAR
  Filled 2015-12-22: qty 0.5

## 2015-12-22 NOTE — ED Provider Notes (Signed)
[facility] Emergency Department Provider Note    First MD Initiated Contact with Patient 12/22/15 1624     (approximate)  I have reviewed the triage vital signs and the nursing notes.   HISTORY  Chief Complaint Dizziness; Chills; and Emesis    HPI Ryan Howe is a 55 y.o. male who presents with 2 days of nausea, dizziness and confusion associated with several episodes of nonbilious nonbloody vomiting. Patient arrives stating he thinks he has vertigo. Patient with diffuse tremors and diaphoretic. Does admit to a history of prolonged alcohol use. States his last drink of alcohol was 3 days prior. Cannot recall if he fell and hit his head. Denies any chest pain or shortness of breath. No recent fevers. Denies any abdominal pain.  While in triage the patient had a witnessed seizure-like event.  On review the chart the patient was recently seen date of August for history of hematochezia. Patient also has reported history of pancreatitis and multiple ulcers from NSAID use. Denies any active hematochezia or melena right now. Plan is for further outpatient management is he was otherwise hemodynamically stable with a stable hematocrit.   Past Medical History:  Diagnosis Date  . Anxiety   . Hypercholesteremia   . Hypertension     There are no active problems to display for this patient.   Past Surgical History:  Procedure Laterality Date  . NASAL RECONSTRUCTION WITH SEPTAL REPAIR      Prior to Admission medications   Medication Sig Start Date End Date Taking? Authorizing Provider  azithromycin (ZITHROMAX Z-PAK) 250 MG tablet Take 2 tablets (500 mg) on  Day 1,  followed by 1 tablet (250 mg) once daily on Days 2 through 5. 07/01/15   Evangeline Dakin, PA-C  diphenoxylate-atropine (LOMOTIL) 2.5-0.025 MG tablet Take 1 tablet by mouth 4 (four) times daily as needed for diarrhea or loose stools. 07/01/15   Charmayne Sheer Beers, PA-C  guaiFENesin-codeine 100-10 MG/5ML  syrup Take 10 mLs by mouth every 4 (four) hours as needed for cough. 07/01/15   Evangeline Dakin, PA-C  ondansetron (ZOFRAN) 4 MG tablet Take 1 tablet (4 mg total) by mouth daily as needed for nausea or vomiting. 07/01/15 06/30/16  Evangeline Dakin, PA-C  predniSONE (DELTASONE) 50 MG tablet Take 1 tablet (50 mg total) by mouth daily with breakfast. 04/02/15   Jene Every, MD    Allergies Review of patient's allergies indicates no known allergies.  History reviewed. No pertinent family history.  Social History Social History  Substance Use Topics  . Smoking status: Never Smoker  . Smokeless tobacco: Never Used  . Alcohol use Yes    Review of Systems Patient denies headaches, rhinorrhea, blurry vision, numbness, shortness of breath, chest pain, edema, cough, abdominal pain, nausea, vomiting, diarrhea, dysuria, fevers, rashes or hallucinations unless otherwise stated above in HPI. ____________________________________________   PHYSICAL EXAM:  VITAL SIGNS: Vitals:   12/22/15 1609  BP: (!) 152/71  Pulse: 94  Resp: 18  Temp: 98.3 F (36.8 C)    Constitutional: Alert and oriented. Tremulous, anxious and ill-appearing Eyes: Conjunctivae are normal. PERRL. EOMI. Head: Atraumatic. Nose: No congestion/rhinnorhea. Mouth/Throat: Mucous membranes are moist.  Oropharynx non-erythematous. Neck: No stridor. Painless ROM. No cervical spine tenderness to palpation Hematological/Lymphatic/Immunilogical: No cervical lymphadenopathy. Cardiovascular: Normal rate, regular rhythm. Grossly normal heart sounds.  Good peripheral circulation. Respiratory: Normal respiratory effort.  No retractions. Lungs CTAB. Gastrointestinal: Soft and nontender. No distention. No abdominal bruits. No CVA tenderness.  Genitourinary:  Musculoskeletal: No lower extremity tenderness nor edema.  No joint effusions. Neurologic:  No focal neuro deficit. He does appear diffusely tremulous with tongue fasciculations. No  nystagmus. No facial droop. Skin:  Skin is warm, dry and intact. No rash noted. Psychiatric: Anxious  ____________________________________________   LABS (all labs ordered are listed, but only abnormal results are displayed)  No results found for this or any previous visit (from the past 24 hour(s)). ____________________________________________  EKG My review and personal interpretation at Time: 16:23   Indication: seizure  Rate: 90  Rhythm: sinus Axis: normal Other: prolonged qt, no acute ischemia ____________________________________________  RADIOLOGY  See chart ____________________________________________   PROCEDURES  Procedure(s) performed: none    Critical Care performed: no ____________________________________________   INITIAL IMPRESSION / ASSESSMENT AND PLAN / ED COURSE  Pertinent labs & imaging results that were available during my care of the patient were reviewed by me and considered in my medical decision making (see chart for details).  DDX: Withdrawal, seizure, O abnormality, sepsis, substance abuse, dysrhythmia  Ryan Howe is a 55 y.o. who presents to the ED with chief complaint of nausea, vomiting as well as tremulous upper extremities and a witnessed seizure-like episode in triage. Patient with history of alcohol abuse. He does appear jaundiced. On initial exam he is afebrile and hemodynamically stable but it appears to be in acute withdrawal from alcohol. Based on his jaundice we'll check labs as I am concerned for acute hepatitis. Based on his nausea vomiting and new seizure will order a head CT to evaluate for acute intracranial abnormality. The patient will be placed on continuous pulse oximetry and telemetry for monitoring.  Laboratory evaluation will be sent to evaluate for the above complaints.     Clinical Course  Value Comment By Time  CO2: (!) 21 (Reviewed) Willy Eddy, MD 09/18 1815  Creatinine: (!) 1.51 (Reviewed) Willy Eddy, MD 09/18 1815  Alkaline Phosphatase: (!) 223 (Reviewed) Willy Eddy, MD 09/18 1815  Total Bilirubin: (!) 4.5 (Reviewed) Willy Eddy, MD 09/18 1815  Hemoglobin: (!) 11.0 (Reviewed) Willy Eddy, MD 09/18 1815  Platelets: (!) 67 (Reviewed) Willy Eddy, MD 09/18 1815   Thrombocytopenia as well as elevated bilirubin and alkaline phosphatase N/A K I noted. CT head without any acute traumatic injury. Will further evaluate with right upper quadrant ultrasound Willy Eddy, MD 09/18 1816  Ammonia: (!) 45 Spoke with Dr. Servando Snare of GI regarding my concerns at this patient. Presentation is likely consistent with acute alcoholic hepatitis though the patient's acute of fall and thrombocytopenia is concerning for portal vein thrombosis therefore we'll further characterize with the Doppler ultrasound of the abdomen. Patient given lactulose for hyperammonemia. We'll continue IV hydration and further hemodynamic monitoring. I spoke with Dr. Elpidio Anis who kindly agrees to admit patient for further evaluation and management. Have discussed with the patient and available family all diagnostics and treatments performed thus far and all questions were answered to the best of my ability. The patient demonstrates understanding and agreement with plan.  Willy Eddy, MD 09/18 1959     ____________________________________________   FINAL CLINICAL IMPRESSION(S) / ED DIAGNOSES  Final diagnoses:  Jaundice  Hepatic encephalopathy (HCC)  Alcoholic hepatitis without ascites  Hepatitis      NEW MEDICATIONS STARTED DURING THIS VISIT:  New Prescriptions   No medications on file     Note:  This document was prepared using Dragon voice recognition software and may include unintentional dictation errors.    Willy Eddy,  MD 12/23/15 0005

## 2015-12-22 NOTE — H&P (Signed)
[facility] [place] [facility]   PATIENT NAME: Ryan BirchwoodMichael Howe    MR#:  [id number]  DATE OF BIRTH:  1960-07-08  DATE OF ADMISSION:  12/22/2015  PR[facility] C[facility]IC[facility]   REQUESTING/REFERRING PHYSICIAN: Dr. Roxan Hockeyobinson  CHIEF COMPLAINT:   Chief Complaint  Patient presents with  . Dizziness  . Chills  . Emesis    HISTORY OF PRESENT ILLNESS:  Ryan BirchwoodMichael Howe  is a 55 y.o. male with a known history of Hypertension, hyperlipidemia, alcohol abuse presents to the emergency room brought in by his mother due to vomiting, weakness. Patient has had on and off vomiting for a few months. [facility] of the abdomen done and discharged home. He was again seen recently by GI for bright red blood per rectum and scheduled for outpatient EGD and colonoscopy in October 6. Today patient is tremulous with his last alcohol drink more than 36 hours. Has elevated liver enzymes along with bilirubin of 4.5. Platelets of 67 and INR 1.38. Ultrasound of the abdomen and is concerning for cirrhosis. Patient is being admitted for acute alcoholic hepatitis and alcohol withdrawal.  PAST MEDICAL HISTORY:   Past Medical History:  Diagnosis Date  . Anxiety   . Hypercholesteremia   . Hypertension     PAST SURGICAL HISTORY:   Past Surgical History:  Procedure Laterality Date  . NASAL RECONSTRUCTION WITH SEPTAL REPAIR      SOCIAL HISTORY:   Social History  Substance Use Topics  . Smoking status: Never Smoker  . Smokeless tobacco: Never Used  . Alcohol use Yes    FAMILY HISTORY:  History reviewed. No pertinent family history. CAD in his father  DRUG ALLERGIES:  No Known Allergies  REVIEW OF SYSTEMS:   Review of Systems  Constitutional: Positive for malaise/fatigue. Negative for chills, fever and weight loss.  HENT: Negative for hearing loss and nosebleeds.   Eyes: Negative for blurred vision, double vision and  pain.  Respiratory: Negative for cough, hemoptysis, sputum production, shortness of breath and wheezing.   Cardiovascular: Negative for chest pain, palpitations, orthopnea and leg swelling.  Gastrointestinal: Positive for nausea and vomiting. Negative for abdominal pain, constipation and diarrhea.  Genitourinary: Negative for dysuria and hematuria.  Musculoskeletal: Negative for back pain, falls and myalgias.  Skin: Negative for rash.  Neurological: Positive for dizziness, tremors and weakness. Negative for sensory change, speech change, focal weakness, seizures and headaches.  Endo/Heme/Allergies: Does not bruise/bleed easily.  Psychiatric/Behavioral: Negative for depression and memory loss. The patient is not nervous/anxious.     MEDICATIONS AT HOME:   Prior to Admission medications   Medication Sig Start Date End Date Taking? Authorizing Provider  citalopram (CELEXA) 10 MG tablet Take 10 mg by mouth at bedtime.    Yes Historical Provider, MD  losartan-hydrochlorothiazide (HYZAAR) 50-12.5 MG tablet Take 1 tablet by mouth daily.   Yes Historical Provider, MD  lovastatin (MEVACOR) 40 MG tablet Take 40 mg by mouth daily.   Yes Historical Provider, MD     VITAL SIGNS:  Blood pressure (!) 148/81, pulse (!) 108, temperature 98.3 F (36.8 C), temperature source Oral, resp. rate 20, height 6' (1.829 m), weight 81.6 kg (180 lb), SpO2 92 %.  PHYSICAL EXAMINATION:  Physical Exam  GENERAL:  55 y.o.-year-old patient lying in the bed with Restless. Tremors. EYES: Pupils equal, round, reactive to light and accommodation.  Extraocular muscles intact. Positive icterus HEENT: Head atraumatic, normocephalic. Oropharynx  and nasopharynx clear. No oropharyngeal erythema, moist oral mucosa  NECK:  Supple, no jugular venous distention. No thyroid enlargement, no tenderness.  LUNGS: Normal breath sounds bilaterally, no wheezing, rales, rhonchi. No use of accessory muscles of respiration.  CARDIOVASCULAR:  S1, S2 normal. No murmurs, rubs, or gallops.  ABDOMEN: Soft, nontender, nondistended. Bowel sounds present. No organomegaly or mass.  EXTREMITIES: No pedal edema, cyanosis, or clubbing. + 2 pedal & radial pulses b/l.   NEUROLOGIC: Cranial nerves II through XII are intact. No focal Motor or sensory deficits appreciated b/l PSYCHIATRIC: The patient is alert and awake. Anxious. SKIN: No obvious rash, lesion, or ulcer.   LABORATORY PANEL:   CBC  Recent Labs Lab 12/22/15 1700  WBC 5.0  HGB 11.0*  HCT 31.6*  PLT 67*   ------------------------------------------------------------------------------------------------------------------  Chemistries   Recent Labs Lab 12/22/15 1700  NA 140  K 4.7  CL 102  CO2 21*  GLUCOSE 167*  BUN 17  CREATININE 1.51*  CALCIUM 9.7  AST 143*  ALT 38  ALKPHOS 223*  BILITOT 4.5*   ------------------------------------------------------------------------------------------------------------------  Cardiac Enzymes  Recent Labs Lab 12/22/15 1720  TROPONINI <0.03   ------------------------------------------------------------------------------------------------------------------  RADIOLOGY:  Ct Head Wo Contrast  Result Date: 12/22/2015 CLINICAL DATA:  Dizziness and nausea.  Gait disturbance. EXAM: CT HEAD WITHOUT CONTRAST TECHNIQUE: Contiguous axial images were obtained from the base of the skull through the vertex without intravenous contrast. COMPARISON:  None. FINDINGS: Brain: There is mild diffuse atrophy. There is no intracranial mass, hemorrhage, extra-axial fluid collection, or midline shift. Gray-white compartments appear normal. No acute infarct evident. Vascular: There is no appreciable hyperdense vessel. There is slight calcification in the left carotid siphon. There is a small calcification at the origin of the basilar artery. Skull: The bony calvarium appears intact. Sinuses/Orbits: Visualized orbits appear symmetric bilaterally. There is  mucosal thickening in multiple ethmoid air cells bilaterally. There is also mild mucosal thickening in the anterior right frontal sinus region. Visualized paranasal sinuses elsewhere clear. Other: Visualized mastoid air cells are clear. IMPRESSION: Mild diffuse atrophy. No intracranial mass, hemorrhage, or extra-axial fluid collection. No focal gray - white compartment lesions are evident. There is rather minimal vascular calcification noted. Areas of paranasal sinus disease noted. Electronically Signed   By: Bretta Bang III M.D.   On: 12/22/2015 17:57   US Abdomen Limited Ruq  Result Date: 12/22/2015 CLINICAL DATA:  Jaundice. EXAM: US ABDOMEN LIMITED - RIGHT UPPER QUADRANT COMPARISON:  None. FINDINGS: Gallbladder: Layering mobile echogenic sludge in the gallbladder. No definite gallstones, wall thickening or pericholecystic fluid. Negative sonographic Murphy sign. Common bile duct: Diameter: 4.0 mm Liver: Extremely course heterogeneous increased echogenicity of the liver. This could be due to fatty infiltration, hepatitis or cirrhosis. No obvious lesions. No intrahepatic biliary dilatation. IMPRESSION: 1. Abnormal ultrasound appearance of the liver as above. 2. Layering echogenic sludge in the gallbladder but no gallstones or evidence of acute cholecystitis. 3. Normal caliber common bile duct. Electronically Signed   By: Rudie Meyer M.D.   On: 12/22/2015 19:16     IMPRESSION AND PLAN:   * Acute alcoholic hepatitis Patient will be admitted to the hospital. Further workup with acute hepatitis panel. Ultrasound of the abdomen showed nothing acute. Consult GI. Repeat labs in the morning. Stop statin. He does have elevated ammonia. But patient is oriented. We'll start him on lactulose and rifaximin.  * Alcohol withdrawal We'll start him on Librium 25mg  3 times a day. 6 doses ordered. Ativan when necessary.  Ordered thiamine and folic acid. IV fluids. CIWA protocol.  * Acute kidney  injury Likely from dehydration. We'll start IV fluids. Monitor input and output. Repeat labs in the morning.  * Hypertension We'll order metoprolol. Continue losartan from home. Hold hydrochlorothiazide.  * Thrombocytopenia due to cirrhosis and needs to be monitored.  * Anemia of chronic disease Patient had bright red blood per rectum few weeks back and was seen at [facility]. He is scheduled for EGD and colonoscopy on October 6.  * DVT prophylaxis with SCDs  All the records are reviewed and case discussed with ED provider. Management plans discussed with the patient, family and they are in agreement.  CODE STATUS: FULL CODE  TOTAL TIME TAKING CARE OF THIS PATIENT: 40 minutes.   Milagros Loll R M.D on 12/22/2015 at 8:34 PM  Between 7am to 6pm - Pager - [phone]  After 6pm go to [email or web address] - password [email or web address] [email or web address]  [facility]  Office  [phone]  CC: Primary care physician; [facility]  Note: This dictation was prepared with Dragon dictation along with smaller phrase technology. Any transcriptional errors that result from this process are unintentional.

## 2015-12-22 NOTE — ED Triage Notes (Signed)
Pt presents to ED with c/o dizziness/shaking and nausea vomiting x9 today. Pt states he is unable to walk.

## 2015-12-23 ENCOUNTER — Inpatient Hospital Stay: Payer: Self-pay

## 2015-12-23 LAB — COMPREHENSIVE METABOLIC PANEL
ALBUMIN: 3.9 g/dL (ref 3.5–5.0)
ALK PHOS: 194 U/L — AB (ref 38–126)
ALT: 34 U/L (ref 17–63)
AST: 132 U/L — AB (ref 15–41)
Anion gap: 7 (ref 5–15)
BILIRUBIN TOTAL: 4.9 mg/dL — AB (ref 0.3–1.2)
BUN: 21 mg/dL — AB (ref 6–20)
CALCIUM: 9 mg/dL (ref 8.9–10.3)
CO2: 25 mmol/L (ref 22–32)
CREATININE: 1.46 mg/dL — AB (ref 0.61–1.24)
Chloride: 105 mmol/L (ref 101–111)
GFR calc Af Amer: >60 mL/min (ref >60)
GFR calc non Af Amer: 52 mL/min — ABNORMAL LOW (ref >60)
GLUCOSE: 85 mg/dL (ref 65–99)
Potassium: 3.5 mmol/L (ref 3.5–5.1)
Sodium: 137 mmol/L (ref 135–145)
TOTAL PROTEIN: 7.9 g/dL (ref 6.5–8.1)

## 2015-12-23 LAB — CBC
HEMATOCRIT: 29.2 % — AB (ref 40.0–52.0)
Hemoglobin: 9.9 g/dL — ABNORMAL LOW (ref 13.0–18.0)
MCH: 31.8 pg (ref 26.0–34.0)
MCHC: 34 g/dL (ref 32.0–36.0)
MCV: 93.8 fL (ref 80.0–100.0)
PLATELETS: 58 10*3/uL — AB (ref 150–440)
RBC: 3.11 MIL/uL — ABNORMAL LOW (ref 4.40–5.90)
RDW: 14.9 % — AB (ref 11.5–14.5)
WBC: 7.6 10*3/uL (ref 3.8–10.6)

## 2015-12-23 LAB — MAGNESIUM: Magnesium: 1.4 mg/dL — ABNORMAL LOW (ref 1.7–2.4)

## 2015-12-23 LAB — AMMONIA: Ammonia: 51 umol/L — ABNORMAL HIGH (ref 9–35)

## 2015-12-23 LAB — PROTIME-INR
INR: 1.49
Prothrombin Time: 18.2 seconds — ABNORMAL HIGH (ref 11.4–15.2)

## 2015-12-23 MED ORDER — POTASSIUM CHLORIDE CRYS ER 20 MEQ PO TBCR
40.0000 meq | EXTENDED_RELEASE_TABLET | Freq: Once | ORAL | Status: AC
  Administered 2015-12-23: 14:00:00 40 meq via ORAL
  Filled 2015-12-23: qty 2

## 2015-12-23 MED ORDER — PREDNISONE 20 MG PO TABS
40.0000 mg | ORAL_TABLET | Freq: Every day | ORAL | Status: DC
  Administered 2015-12-23 – 2015-12-24 (×2): 40 mg via ORAL
  Filled 2015-12-23 (×2): qty 2

## 2015-12-23 MED ORDER — ENSURE ENLIVE PO LIQD
237.0000 mL | Freq: Two times a day (BID) | ORAL | Status: DC
  Administered 2015-12-23 – 2015-12-26 (×7): 237 mL via ORAL

## 2015-12-23 MED ORDER — MAGNESIUM SULFATE 2 GM/50ML IV SOLN
2.0000 g | Freq: Once | INTRAVENOUS | Status: AC
  Administered 2015-12-23: 14:00:00 2 g via INTRAVENOUS
  Filled 2015-12-23: qty 50

## 2015-12-23 NOTE — Progress Notes (Signed)
Initial Nutrition Assessment  DOCUMENTATION CODES:   Not applicable  INTERVENTION:  -Monitor intake and cater to pt preferences. Encouraged good sources of protein.   -Recommend Ensure Enlive po BID, each supplement provides 350 kcal and 20 grams of protein    NUTRITION DIAGNOSIS:   Predicted suboptimal nutrient intake related to  (etoh use) as evidenced by percent weight loss.    GOAL:   Patient will meet greater than or equal to 90% of their needs    MONITOR:   PO intake, Supplement acceptance, Weight trends  REASON FOR ASSESSMENT:   Malnutrition Screening Tool    ASSESSMENT:     55 y.o. Male admitted with dizziness, chills, emesis. Pt with alcoholic hepatitis, alcohol withdrawal, AKI, Past medical history of HTN, HLD, etoh abuse.    Pt reports poor po intake for the last 3 days prior to admission secondary to nausea, vomiting, shaking and hard to hold drink.  Mother at bedside  Medications reviewed: folic acid, lactulose, MVI, thamine, NS with KCL at 3275ml/hr Labs reviewed: BUN 21, creatinine 1.46, Mag 1.4, ammonia 51   Nutrition-Focused physical exam completed. Findings are no fat depletion, no  muscle depletion in areas able to assess, and unable to assess edema.     Diet Order:  Diet regular Room service appropriate? Yes; Fluid consistency: Thin  Skin:  Reviewed, no issues  Last BM:  9/19  Height:   Ht Readings from Last 1 Encounters:  12/22/15 6' (1.829 m)    Weight: Pt reports weighed 221 pounds about 6 months ago, per chart noted 200 pounds on 07/01/15, current wt of 176 pounds (20% wt loss in the last 6 months)  Wt Readings from Last 1 Encounters:  12/23/15 176 lb (79.8 kg)    Ideal Body Weight:     BMI:  Body mass index is 23.87 kg/m.  Estimated Nutritional Needs:   Kcal:  2000-2400 kcals/d  Protein:  80-96 g/d  Fluid:  >/= 2 L/d  EDUCATION NEEDS:   No education needs identified at this time  Danarius Mcconathy B. Freida BusmanAllen, RD,  LDN [phone] (pager) Weekend/On-Call pager [phone])

## 2015-12-23 NOTE — Progress Notes (Signed)
Dr Renae GlossWieting made aware that pt pulled his IV out, pt states that he doesn't want any fluids to be infusing, per Dr Renae GlossWieting ok to discontinue fluids

## 2015-12-23 NOTE — Progress Notes (Signed)
Patient ID: Ryan PolioMichael J Neu, male   DOB: 17-Aug-1960, 55 y.o.   MRN: [id number]  [employer] PROGRESS NOTE  Ryan Howe NWG:[id number] DOB: 17-Aug-1960 DOA: 12/22/2015 [facility]  HPI/Subjective: Patient pulled out his IV and stated he doesn't want any more IV fluids. Patient states that he feels better than when he came in. Patient had dizziness chills vomiting. He does drink quite a bit a alcohol as outpatient.  Objective: Vitals:   12/23/15 0425 12/23/15 1309  BP: (!) 141/79 139/76  Pulse: 77 70  Resp:  18  Temp: 98.5 F (36.9 C) 98.6 F (37 C)    Filed Weights   12/22/15 1610 12/22/15 2231 12/23/15 0500  Weight: 81.6 kg (180 lb) 80.3 kg (177 lb) 79.8 kg (176 lb)    ROS: Review of Systems  Constitutional: Negative for chills and fever.  Eyes: Negative for blurred vision.  Respiratory: Negative for cough and shortness of breath.   Cardiovascular: Negative for chest pain.  Gastrointestinal: Positive for abdominal pain. Negative for constipation, diarrhea, nausea and vomiting.  Genitourinary: Negative for dysuria.  Musculoskeletal: Negative for joint pain.  Neurological: Negative for dizziness and headaches.   Exam: Physical Exam  HENT:  Nose: No mucosal edema.  Mouth/Throat: No oropharyngeal exudate or posterior oropharyngeal edema.  Eyes: EOM and lids are normal. Pupils are equal, round, and reactive to light.  Icteric  Neck: No JVD present. Carotid bruit is not present. No edema present. No thyroid mass and no thyromegaly present.  Cardiovascular: S1 normal and S2 normal.  Exam reveals no gallop.   No murmur heard. Pulses:      Dorsalis pedis pulses are 2+ on the right side, and 2+ on the left side.  Respiratory: No respiratory distress. He has no wheezes. He has no rhonchi. He has no rales.  GI: Soft. Bowel sounds are normal. There is no tenderness.  Musculoskeletal:       Right ankle: He exhibits no swelling.       Left ankle:  He exhibits no swelling.  Positive tremor  Lymphadenopathy:    He has no cervical adenopathy.  Neurological: He is alert. No cranial nerve deficit.  Skin: Skin is warm. No rash noted. Nails show no clubbing.  Psychiatric: He has a normal mood and affect.      Data Reviewed: Basic Metabolic Panel:  Recent Labs Lab 12/22/15 1700 12/23/15 0530  NA 140 137  K 4.7 3.5  CL 102 105  CO2 21* 25  GLUCOSE 167* 85  BUN 17 21*  CREATININE 1.51* 1.46*  CALCIUM 9.7 9.0  MG  --  1.4*   Liver Function Tests:  Recent Labs Lab 12/22/15 1700 12/23/15 0530  AST 143* 132*  ALT 38 34  ALKPHOS 223* 194*  BILITOT 4.5* 4.9*  PROT 9.3* 7.9  ALBUMIN 4.5 3.9   No results for input(s): LIPASE, AMYLASE in the last 168 hours.  Recent Labs Lab 12/22/15 1900 12/23/15 0530  AMMONIA 45* 51*   CBC:  Recent Labs Lab 12/22/15 1700 12/23/15 0530  WBC 5.0 7.6  NEUTROABS 4.2  --   HGB 11.0* 9.9*  HCT 31.6* 29.2*  MCV 93.0 93.8  PLT 67* 58*   Cardiac Enzymes:  Recent Labs Lab 12/22/15 1720  TROPONINI <0.03     Studies: Ct Head Wo Contrast  Result Date: 12/22/2015 CLINICAL DATA:  Dizziness and nausea.  Gait disturbance. EXAM: CT HEAD WITHOUT CONTRAST TECHNIQUE: Contiguous axial images were obtained from the  base of the skull through the vertex without intravenous contrast. COMPARISON:  None. FINDINGS: Brain: There is mild diffuse atrophy. There is no intracranial mass, hemorrhage, extra-axial fluid collection, or midline shift. Gray-white compartments appear normal. No acute infarct evident. Vascular: There is no appreciable hyperdense vessel. There is slight calcification in the left carotid siphon. There is a small calcification at the origin of the basilar artery. Skull: The bony calvarium appears intact. Sinuses/Orbits: Visualized orbits appear symmetric bilaterally. There is mucosal thickening in multiple ethmoid air cells bilaterally. There is also mild mucosal thickening in the  anterior right frontal sinus region. Visualized paranasal sinuses elsewhere clear. Other: Visualized mastoid air cells are clear. IMPRESSION: Mild diffuse atrophy. No intracranial mass, hemorrhage, or extra-axial fluid collection. No focal gray - white compartment lesions are evident. There is rather minimal vascular calcification noted. Areas of paranasal sinus disease noted. Electronically Signed   By: Bretta Bang III M.D.   On: 12/22/2015 17:57   [place] Art/ven Flow Abd Pelv Doppler  Result Date: 12/23/2015 CLINICAL DATA:  55 year old male with splenomegaly EXAM: DUPLEX ULTRASOUND OF LIVER TECHNIQUE: Color and duplex Doppler ultrasound was performed to evaluate the hepatic in-flow and out-flow vessels. COMPARISON:  None. FINDINGS: Portal Vein Velocities Main:  52.5 cm/sec Right:  34.2 cm/sec Left:  24.7 cm/sec Hepatic Vein Velocities Right:  24.7 cm/sec Middle:  20.5 cm/sec Left:  31.0 cm/sec Hepatic Artery Velocity:  208 cm/sec Splenic Vein Velocity:  34.2 cm/sec Varices: None visualized Ascites: None visualized Spleen: 12.1 cm x 5.5 cm x 15.4 cm, with total volume of 541 cm cubic cm. Liver:  Heterogeneous echotexture. IMPRESSION: Heterogeneous appearance of the liver, indicating underlying medical liver disease. Splenomegaly. Hepatopetal flow of the portal venous system. Signed, Yvone Neu. Loreta Ave, DO [facility] Electronically Signed   By: Gilmer Mor D.O.   On: 12/23/2015 12:05   US Abdomen Limited Ruq  Result Date: 12/22/2015 CLINICAL DATA:  Jaundice. EXAM: US ABDOMEN LIMITED - RIGHT UPPER QUADRANT COMPARISON:  None. FINDINGS: Gallbladder: Layering mobile echogenic sludge in the gallbladder. No definite gallstones, wall thickening or pericholecystic fluid. Negative sonographic Murphy sign. Common bile duct: Diameter: 4.0 mm Liver: Extremely course heterogeneous increased echogenicity of the liver. This could be due to fatty infiltration,  hepatitis or cirrhosis. No obvious lesions. No intrahepatic biliary dilatation. IMPRESSION: 1. Abnormal ultrasound appearance of the liver as above. 2. Layering echogenic sludge in the gallbladder but no gallstones or evidence of acute cholecystitis. 3. Normal caliber common bile duct. Electronically Signed   By: Rudie Meyer M.D.   On: 12/22/2015 19:16    Scheduled Meds: . chlordiazePOXIDE  25 mg Oral TID  . citalopram  10 mg Oral QHS  . feeding supplement (ENSURE ENLIVE)  237 mL Oral BID BM  . folic acid  1 mg Oral Daily  . lactulose  30 g Oral BID  . LORazepam  0-4 mg Oral Q6H   Followed by  . [START ON 12/24/2015] LORazepam  0-4 mg Oral Q12H  . losartan  50 mg Oral Daily  . magnesium sulfate 1 - 4 g bolus IVPB  2 g Intravenous Once  . metoprolol tartrate  25 mg Oral BID  . multivitamin with minerals  1 tablet Oral Daily  . predniSONE  40 mg Oral QAC breakfast  . rifaximin  550 mg Oral BID  . sodium chloride flush  3 mL Intravenous Q12H  . thiamine injection  100 mg Intravenous Once  . thiamine  100 mg Oral Daily    Assessment/Plan:  1. Alcoholic hepatitis. Start prednisolone 40 mg daily. Continue to watch liver function tests. 2. Hypomagnesemia replace magnesium IV 3. Alcohol abuse and withdrawal. Put on CIWA protocol.  4. Signs of alcoholic cirrhosis seen with coagulopathy, thrombocytopenia and alcoholic hepatitis. Patient was advised that he must stop drinking. 5. Hepatic encephalopathy with confusion. Started on Xifaxan and lactulose 6. Essential hypertension on losartan  Code Status:     Code Status Orders        Start     Ordered   12/22/15 2030  Full code  Continuous     12/22/15 2030    Code Status History    Date Active Date Inactive Code Status Order ID Comments User Context   12/22/2015  4:27 PM 12/22/2015  8:30 PM Full Code [id number]  Willy Eddy, MD ED     Family Communication: Mother at the bedside Disposition Plan: To be determined  Time spent:  25 minutes  Alford Highland  [employer]

## 2015-12-24 LAB — COMPREHENSIVE METABOLIC PANEL
ALBUMIN: 3.6 g/dL (ref 3.5–5.0)
ALT: 35 U/L (ref 17–63)
AST: 128 U/L — AB (ref 15–41)
Alkaline Phosphatase: 216 U/L — ABNORMAL HIGH (ref 38–126)
Anion gap: 7 (ref 5–15)
BUN: 26 mg/dL — AB (ref 6–20)
CHLORIDE: 107 mmol/L (ref 101–111)
CO2: 24 mmol/L (ref 22–32)
Calcium: 9.5 mg/dL (ref 8.9–10.3)
Creatinine, Ser: 1.27 mg/dL — ABNORMAL HIGH (ref 0.61–1.24)
GFR calc Af Amer: >60 mL/min (ref >60)
Glucose, Bld: 112 mg/dL — ABNORMAL HIGH (ref 65–99)
POTASSIUM: 3.9 mmol/L (ref 3.5–5.1)
SODIUM: 138 mmol/L (ref 135–145)
Total Bilirubin: 4.3 mg/dL — ABNORMAL HIGH (ref 0.3–1.2)
Total Protein: 7.7 g/dL (ref 6.5–8.1)

## 2015-12-24 LAB — HEPATITIS PANEL, ACUTE
HEP A IGM: NEGATIVE
HEP B C IGM: NEGATIVE
Hepatitis B Surface Ag: NEGATIVE

## 2015-12-24 MED ORDER — PREDNISOLONE SODIUM PHOSPHATE 15 MG/5ML PO SOLN
40.0000 mg | Freq: Every day | ORAL | Status: DC
  Administered 2015-12-25 – 2015-12-26 (×2): 40 mg via ORAL
  Filled 2015-12-24 (×2): qty 15

## 2015-12-24 NOTE — Consult Note (Signed)
GI Inpatient Consult Note  Reason for Consult: Alcoholic hepatitis   Attending Requesting Consult: Dr. Leslye Howe  History of Present Illness:  Ryan Howe is a 55 y.o. male with a known history of EtOH abuse, pancreatitis, HTN, HLD, and anxiety admitted with alcoholic hepatitis.  Patient was brought to the [facility] ED by his mother for evaluation of dizziness, chills, and vomiting.  Symptoms began 2 days ago, and patient noted last EtOH drink was 3 days prior.  GI history is notable for extensive EtOH abuse.  Patient was recently evaluated by [facility] for recent rectal bleeding, and scheduled for a colonoscopy on 01/09/16.  He was also evaluated in follow-up of acute pancreatitis in 07/2015.  LFTs and T bili were elevated at that time, and a CT A/P demonstrated hepatomegaly and hepatic steatosis.  Serologic workup was recommended if LFT elevations persisted, but it does not appear this was completed.  Upon arrival to the [facility] ED, labs were notable for AST 143, alk phos 223,  T bili 4.5, INR 1.3, platelets 67, and ammonia 45.  Hgb was stable and 11, and patient denied current rectal bleeding.  Acute hepatitis panel was negative.  Right upper quadrant ultrasound was negative for acute findings, but did note layering echogenic sludge in the gallbladder and extremely coarse echotexture of the liver.  CT had demonstrated mild diffuse atrophy with minimal vascular calcification, also negative for acute findings.  Patient was admitted for further management, including initiation of lactulose and Xifaxan for encephalopathy, Librium/thiamine/folic acid for EtOH withdrawal, and IV fluids for AKI.  GI consult was ordered for further management of acute hepatitis.  Since admission, AST, alk phos, T bili, INR, and ammonia are stable, but unimproved. Significant thrombocytopenia remains, with platelets 58.  Prednisolone 76m daily was also initiated for EtOH hepatitis.  Today, patient states he is feeling "75-80%  better" since admission.  Tremors and dizziness are much improved, though mild tremors are present.  He has not been ambulating much due to fall risk.  Nausea and vomiting are also much improved; he is tolerating a clear liquid diet satisfactorily.  Patient notes multiple episodes of loose stool since beginning lactulose.  His mother also feels he is still confused intermittently, but improving.  No fevers, chills, abdominal swelling, LE swelling, frank blood in stool, or melena.  Also no upper GI concerns like dysphagia, GERD, or epigastric pain.  Past Medical History:  Past Medical History:  Diagnosis Date  . Anxiety   . Hypercholesteremia   . Hypertension     Problem List: Patient Active Problem List   Diagnosis Date Noted  . Liver failure (HRainbow 12/22/2015    Past Surgical History: Past Surgical History:  Procedure Laterality Date  . NASAL RECONSTRUCTION WITH SEPTAL REPAIR      Allergies: No Known Allergies  Home Medications: Prescriptions Prior to Admission  Medication Sig Dispense Refill Last Dose  . citalopram (CELEXA) 10 MG tablet Take 10 mg by mouth at bedtime.    12/20/2015 at pm  . losartan-hydrochlorothiazide (HYZAAR) 50-12.5 MG tablet Take 1 tablet by mouth daily.   12/21/2015 at am  . lovastatin (MEVACOR) 40 MG tablet Take 40 mg by mouth daily.   12/21/2015 at am   Home medication reconciliation was completed with the patient.   Scheduled Inpatient Medications:   . chlordiazePOXIDE  25 mg Oral TID  . citalopram  10 mg Oral QHS  . feeding supplement (ENSURE ENLIVE)  237 mL Oral BID BM  . folic  acid  1 mg Oral Daily  . lactulose  30 g Oral BID  . LORazepam  0-4 mg Oral Q6H   Followed by  . LORazepam  0-4 mg Oral Q12H  . losartan  50 mg Oral Daily  . metoprolol tartrate  25 mg Oral BID  . multivitamin with minerals  1 tablet Oral Daily  . predniSONE  40 mg Oral QAC breakfast  . rifaximin  550 mg Oral BID  . sodium chloride flush  3 mL Intravenous Q12H  . thiamine   100 mg Oral Daily    Continuous Inpatient Infusions:     PRN Inpatient Medications:  acetaminophen **OR** acetaminophen, albuterol, LORazepam **OR** LORazepam, ondansetron **OR** ondansetron (ZOFRAN) IV, oxyCODONE, polyethylene glycol  Family History: family history is not on file.   Social History:   reports that he has never smoked. He has never used smokeless tobacco. He reports that he drinks alcohol.  Review of Systems: Constitutional: Weight is stable.  Eyes: No changes in vision. ENT: No oral lesions, sore throat.  GI: see HPI.  Heme/Lymph: No easy bruising.  CV: No chest pain.  GU: No hematuria.  Integumentary: No rashes.  Neuro: No headaches.  Psych: No depression/anxiety.  Endocrine: No heat/cold intolerance.  Allergic/Immunologic: No urticaria.  Resp: No cough, SOB.  Musculoskeletal: No joint swelling.    Physical Examination: BP 127/71   Pulse 73   Temp 98.9 F (37.2 C) (Oral)   Resp 18   Ht 6' (1.829 m)   Wt 79.8 kg (176 lb)   SpO2 95%   BMI 23.87 kg/m  Gen: NAD, alert and oriented x 4 HEENT: PEERLA, EOMI, Neck: supple, no JVD or thyromegaly Chest: CTA bilaterally, no wheezes, crackles, or other adventitious sounds CV: RRR, no m/g/c/r Abd: soft, NT, ND, +BS in all four quadrants; no HSM, guarding, ridigity, or rebound tenderness Ext: no edema, well perfused with 2+ pulses, Skin: no rash or lesions noted Lymph: no LAD  Data: Lab Results  Component Value Date   WBC 7.6 12/23/2015   HGB 9.9 (L) 12/23/2015   HCT 29.2 (L) 12/23/2015   MCV 93.8 12/23/2015   PLT 58 (L) 12/23/2015    Recent Labs Lab 12/22/15 1700 12/23/15 0530  HGB 11.0* 9.9*   Lab Results  Component Value Date   NA 138 12/24/2015   K 3.9 12/24/2015   CL 107 12/24/2015   CO2 24 12/24/2015   BUN 26 (H) 12/24/2015   CREATININE 1.27 (H) 12/24/2015   Lab Results  Component Value Date   ALT 35 12/24/2015   AST 128 (H) 12/24/2015   ALKPHOS 216 (H) 12/24/2015   BILITOT  4.3 (H) 12/24/2015    Recent Labs Lab 12/23/15 0530  INR 1.49   Assessment/Plan: Mr. Ryan Howe is a 55 y.o. male with a known history of EtOH abuse, pancreatitis, HTN, HLD, and anxiety admitted with alcoholic hepatitis.  He is now receiving IV fluids for AKI, Prednisolone for EtOH hepatitis, lactulose/Xifaxan for encephalopathy, and Librium/thiamine/folic acid for EtOH withdrawal.  LFTs and liver synthetic function are unimproved since admission, but stable.  Current MELD score is 19.  Recommendations: - Continue Prednisolone, Lactulose, and Xifaxan - Very strong advised absolute EtOH cessation - resources to assist with this offered - Advised avoiding Tylenol (<2g daily) - Encourage low sodium diet (<2g daily) - Continue to monitor, but likely stable for discharge from a GI standpoint as kidneys, gait, tremors, etc improve - Patient is established with [facility], so recommend close  follow-up upon discharge  Thank you for the consult. We will follow along with you. Please call with questions or concerns.  Lavera Guise, PA-C [facility] Phone: [phone] Pager: [phone]

## 2015-12-24 NOTE — Progress Notes (Signed)
Patient ID: Ryan Howe, male   DOB: Oct 27, 1960, 55 y.o.   MRN: [id number]  [employer] PROGRESS NOTE  Ryan Howe [id number] DOB: Oct 27, 1960 DOA: 12/22/2015 [facility]  HPI/Subjective: Patient states that he sees [address] [employer] and thought the bed rails were people. He also thought the TV in the clock were on the ceiling  Objective: Vitals:   12/24/15 0924 12/24/15 1416  BP: 127/71 130/62  Pulse: 73 88  Resp: 18 20  Temp: 98.9 F (37.2 C) 98 F (36.7 C)    Filed Weights   12/22/15 1610 12/22/15 2231 12/23/15 0500  Weight: 81.6 kg (180 lb) 80.3 kg (177 lb) 79.8 kg (176 lb)    ROS: Review of Systems  Constitutional: Negative for chills and fever.  Eyes: Negative for blurred vision.  Respiratory: Negative for cough and shortness of breath.   Cardiovascular: Negative for chest pain.  Gastrointestinal: Positive for abdominal pain. Negative for constipation, diarrhea, nausea and vomiting.  Genitourinary: Negative for dysuria.  Musculoskeletal: Negative for joint pain.  Neurological: Negative for dizziness and headaches.   Exam: Physical Exam  HENT:  Nose: No mucosal edema.  Mouth/Throat: No oropharyngeal exudate or posterior oropharyngeal edema.  Eyes: EOM and lids are normal. Pupils are equal, round, and reactive to light.  Icteric  Neck: No JVD present. Carotid bruit is not present. No edema present. No thyroid mass and no thyromegaly present.  Cardiovascular: S1 normal and S2 normal.  Exam reveals no gallop.   No murmur heard. Pulses:      Dorsalis pedis pulses are 2+ on the right side, and 2+ on the left side.  Respiratory: No respiratory distress. He has no wheezes. He has no rhonchi. He has no rales.  GI: Soft. Bowel sounds are normal. There is no tenderness.  Musculoskeletal:       Right ankle: He exhibits no swelling.       Left ankle: He exhibits no swelling.  Positive tremor  Lymphadenopathy:    He has no cervical  adenopathy.  Neurological: He is alert. No cranial nerve deficit.  Skin: Skin is warm. No rash noted. Nails show no clubbing.  Psychiatric: He has a normal mood and affect.      Data Reviewed: Basic Metabolic Panel:  Recent Labs Lab 12/22/15 1700 12/23/15 0530 12/24/15 0457  NA 140 137 138  K 4.7 3.5 3.9  CL 102 105 107  CO2 21* 25 24  GLUCOSE 167* 85 112*  BUN 17 21* 26*  CREATININE 1.51* 1.46* 1.27*  CALCIUM 9.7 9.0 9.5  MG  --  1.4*  --    Liver Function Tests:  Recent Labs Lab 12/22/15 1700 12/23/15 0530 12/24/15 0457  AST 143* 132* 128*  ALT 38 34 35  ALKPHOS 223* 194* 216*  BILITOT 4.5* 4.9* 4.3*  PROT 9.3* 7.9 7.7  ALBUMIN 4.5 3.9 3.6    Recent Labs Lab 12/22/15 1900 12/23/15 0530  AMMONIA 45* 51*   CBC:  Recent Labs Lab 12/22/15 1700 12/23/15 0530  WBC 5.0 7.6  NEUTROABS 4.2  --   HGB 11.0* 9.9*  HCT 31.6* 29.2*  MCV 93.0 93.8  PLT 67* 58*   Cardiac Enzymes:  Recent Labs Lab 12/22/15 1720  TROPONINI <0.03     Studies: Ct Head Wo Contrast  Result Date: 12/22/2015 CLINICAL DATA:  Dizziness and nausea.  Gait disturbance. EXAM: CT HEAD WITHOUT CONTRAST TECHNIQUE: Contiguous axial images were obtained from the base of the skull through  the vertex without intravenous contrast. COMPARISON:  None. FINDINGS: Brain: There is mild diffuse atrophy. There is no intracranial mass, hemorrhage, extra-axial fluid collection, or midline shift. Gray-white compartments appear normal. No acute infarct evident. Vascular: There is no appreciable hyperdense vessel. There is slight calcification in the left carotid siphon. There is a small calcification at the origin of the basilar artery. Skull: The bony calvarium appears intact. Sinuses/Orbits: Visualized orbits appear symmetric bilaterally. There is mucosal thickening in multiple ethmoid air cells bilaterally. There is also mild mucosal thickening in the anterior right frontal sinus region. Visualized  paranasal sinuses elsewhere clear. Other: Visualized mastoid air cells are clear. IMPRESSION: Mild diffuse atrophy. No intracranial mass, hemorrhage, or extra-axial fluid collection. No focal gray - white compartment lesions are evident. There is rather minimal vascular calcification noted. Areas of paranasal sinus disease noted. Electronically Signed   By: Bretta Bang III M.D.   On: 12/22/2015 17:57   [place] Art/ven Flow Abd Pelv Doppler  Result Date: 12/23/2015 CLINICAL DATA:  55 year old male with splenomegaly EXAM: DUPLEX ULTRASOUND OF LIVER TECHNIQUE: Color and duplex Doppler ultrasound was performed to evaluate the hepatic in-flow and out-flow vessels. COMPARISON:  None. FINDINGS: Portal Vein Velocities Main:  52.5 cm/sec Right:  34.2 cm/sec Left:  24.7 cm/sec Hepatic Vein Velocities Right:  24.7 cm/sec Middle:  20.5 cm/sec Left:  31.0 cm/sec Hepatic Artery Velocity:  208 cm/sec Splenic Vein Velocity:  34.2 cm/sec Varices: None visualized Ascites: None visualized Spleen: 12.1 cm x 5.5 cm x 15.4 cm, with total volume of 541 cm cubic cm. Liver:  Heterogeneous echotexture. IMPRESSION: Heterogeneous appearance of the liver, indicating underlying medical liver disease. Splenomegaly. Hepatopetal flow of the portal venous system. Signed, Yvone Neu. Loreta Ave, DO [facility] Electronically Signed   By: Gilmer Mor D.O.   On: 12/23/2015 12:05   US Abdomen Limited Ruq  Result Date: 12/22/2015 CLINICAL DATA:  Jaundice. EXAM: US ABDOMEN LIMITED - RIGHT UPPER QUADRANT COMPARISON:  None. FINDINGS: Gallbladder: Layering mobile echogenic sludge in the gallbladder. No definite gallstones, wall thickening or pericholecystic fluid. Negative sonographic Murphy sign. Common bile duct: Diameter: 4.0 mm Liver: Extremely course heterogeneous increased echogenicity of the liver. This could be due to fatty infiltration, hepatitis or cirrhosis. No obvious lesions. No  intrahepatic biliary dilatation. IMPRESSION: 1. Abnormal ultrasound appearance of the liver as above. 2. Layering echogenic sludge in the gallbladder but no gallstones or evidence of acute cholecystitis. 3. Normal caliber common bile duct. Electronically Signed   By: Rudie Meyer M.D.   On: 12/22/2015 19:16    Scheduled Meds: . chlordiazePOXIDE  25 mg Oral TID  . citalopram  10 mg Oral QHS  . feeding supplement (ENSURE ENLIVE)  237 mL Oral BID BM  . folic acid  1 mg Oral Daily  . lactulose  30 g Oral BID  . LORazepam  0-4 mg Oral Q6H   Followed by  . LORazepam  0-4 mg Oral Q12H  . losartan  50 mg Oral Daily  . metoprolol tartrate  25 mg Oral BID  . multivitamin with minerals  1 tablet Oral Daily  . predniSONE  40 mg Oral QAC breakfast  . rifaximin  550 mg Oral BID  . sodium chloride flush  3 mL Intravenous Q12H  . thiamine  100 mg Oral Daily    Assessment/Plan:  1. Alcoholic hepatitis. Start prednisolone 40 mg daily. Continue to watch liver function tests. 2. Alcohol abuse and withdrawal with hallulcinations. Put  on CIWA protocol.  3. Hypomagnesemia replaced yesterday. Check again tomorrow 4. Signs of alcoholic cirrhosis seen with sonogram of abdomen,  coagulopathy, thrombocytopenia and alcoholic hepatitis. Patient was advised that he must stop drinking. 5. Hepatic encephalopathy with confusion. Started on Xifaxan and lactulose 6. Essential hypertension on losartan  Code Status:     Code Status Orders        Start     Ordered   12/22/15 2030  Full code  Continuous     12/22/15 2030    Code Status History    Date Active Date Inactive Code Status Order ID Comments User Context   12/22/2015  4:27 PM 12/22/2015  8:30 PM Full Code [id number]  Willy Eddy, MD ED     Family Communication: Mother at the bedside Disposition Plan: To be determined  Time spent: 24 minutes  Alford Highland  [employer]

## 2015-12-24 NOTE — Consult Note (Signed)
Patient seen by PA, discussed with her.  He is eating a chicken sandwich and says he feels 90% better than when he came into the hospital.  [facility]  I have no further recommendations except to taper his prednisone over 3 weeks.

## 2015-12-25 LAB — COMPREHENSIVE METABOLIC PANEL
ALBUMIN: 3.4 g/dL — AB (ref 3.5–5.0)
ALT: 38 U/L (ref 17–63)
AST: 104 U/L — AB (ref 15–41)
Alkaline Phosphatase: 188 U/L — ABNORMAL HIGH (ref 38–126)
Anion gap: 7 (ref 5–15)
BUN: 24 mg/dL — AB (ref 6–20)
CHLORIDE: 108 mmol/L (ref 101–111)
CO2: 21 mmol/L — AB (ref 22–32)
CREATININE: 1.21 mg/dL (ref 0.61–1.24)
Calcium: 9.7 mg/dL (ref 8.9–10.3)
GFR calc Af Amer: >60 mL/min (ref >60)
GFR calc non Af Amer: >60 mL/min (ref >60)
Glucose, Bld: 106 mg/dL — ABNORMAL HIGH (ref 65–99)
POTASSIUM: 3.4 mmol/L — AB (ref 3.5–5.1)
SODIUM: 136 mmol/L (ref 135–145)
Total Bilirubin: 3.4 mg/dL — ABNORMAL HIGH (ref 0.3–1.2)
Total Protein: 7.1 g/dL (ref 6.5–8.1)

## 2015-12-25 LAB — HEPATITIS PANEL, ACUTE
HCV Ab: <0.1 s/co ratio (ref 0.0–0.9)
Hep A IgM: NEGATIVE
Hep B C IgM: NEGATIVE
Hepatitis B Surface Ag: NEGATIVE

## 2015-12-25 LAB — MAGNESIUM: MAGNESIUM: 1.5 mg/dL — AB (ref 1.7–2.4)

## 2015-12-25 LAB — AMMONIA: Ammonia: 59 umol/L — ABNORMAL HIGH (ref 9–35)

## 2015-12-25 MED ORDER — MAGNESIUM SULFATE 2 GM/50ML IV SOLN
2.0000 g | Freq: Once | INTRAVENOUS | Status: AC
  Administered 2015-12-25: 09:00:00 2 g via INTRAVENOUS
  Filled 2015-12-25: qty 50

## 2015-12-25 MED ORDER — POTASSIUM CHLORIDE CRYS ER 20 MEQ PO TBCR
40.0000 meq | EXTENDED_RELEASE_TABLET | Freq: Once | ORAL | Status: AC
  Administered 2015-12-25: 40 meq via ORAL
  Filled 2015-12-25: qty 2

## 2015-12-25 NOTE — [place] FL2 (Signed)
[place] MEDICAID FL2 LEVEL OF CARE SCREENING TOOL     IDENTIFICATION  Patient Name: Ryan Howe Birthdate: 1960-10-15 Sex: male Admission Date (Current Location): 12/22/2015  [place]   Facility and Address:  [facility], [address], [place], [place] [address]      Provider Number: [id number]  Attending Physician Name and Address:  Alford Highlandichard Wieting, MD  Relative Name and Phone Number:       Current Level of Care: Hospital Recommended Level of Care: Skilled Nursing Facility Prior Approval Number:    Date Approved/Denied:   PASRR Number: [id number]  Discharge Plan: SNF    Current Diagnoses: Patient Active Problem List   Diagnosis Date Noted  . Liver failure (HCC) 12/22/2015    Orientation RESPIRATION BLADDER Height & Weight     Self, Time, Situation, Place  Normal Continent Weight: 186 lb 6.4 oz (84.6 kg) Height:  6' (182.9 cm)  BEHAVIORAL SYMPTOMS/MOOD NEUROLOGICAL BOWEL NUTRITION STATUS   (None.)  (None.) Continent Diet (Regular Diet)  AMBULATORY STATUS COMMUNICATION OF NEEDS Skin   Extensive Assist Verbally Normal                       Personal Care Assistance Level of Assistance  Bathing, Feeding, Dressing Bathing Assistance: Limited assistance Feeding assistance: Independent Dressing Assistance: Limited assistance     Functional Limitations Info  Sight, Speech, Hearing Sight Info: Adequate Hearing Info: Adequate Speech Info: Adequate    SPECIAL CARE FACTORS FREQUENCY  PT (By licensed PT)     PT Frequency: 5              Contractures      Additional Factors Info  Code Status, Allergies Code Status Info: Full Code Allergies Info: No Known Allergies           Current Medications (12/25/2015):  This is the current hospital active medication list Current Facility-Administered Medications  Medication Dose Route Frequency Provider Last Rate Last Dose  . acetaminophen  (TYLENOL) tablet 650 mg  650 mg Oral Q6H PRN Milagros LollSrikar Sudini, MD       Or  . acetaminophen (TYLENOL) suppository 650 mg  650 mg Rectal Q6H PRN Srikar Sudini, MD      . albuterol (PROVENTIL) (2.5 MG/3ML) 0.083% nebulizer solution 2.5 mg  2.5 mg Nebulization Q2H PRN Srikar Sudini, MD      . citalopram (CELEXA) tablet 10 mg  10 mg Oral QHS Milagros LollSrikar Sudini, MD   10 mg at 12/24/15 2203  . feeding supplement (ENSURE ENLIVE) (ENSURE ENLIVE) liquid 237 mL  237 mL Oral BID BM Alford Highlandichard Wieting, MD   237 mL at 12/25/15 0847  . folic acid (FOLVITE) tablet 1 mg  1 mg Oral Daily Milagros LollSrikar Sudini, MD   1 mg at 12/25/15 0845  . lactulose (CHRONULAC) 10 GM/15ML solution 30 g  30 g Oral BID Milagros LollSrikar Sudini, MD   30 g at 12/25/15 0846  . LORazepam (ATIVAN) tablet 1 mg  1 mg Oral Q6H PRN Milagros LollSrikar Sudini, MD   1 mg at 12/25/15 0505   Or  . LORazepam (ATIVAN) injection 1 mg  1 mg Intravenous Q6H PRN Milagros LollSrikar Sudini, MD   1 mg at 12/23/15 1147  . LORazepam (ATIVAN) tablet 0-4 mg  0-4 mg Oral Q12H Willy EddyPatrick Robinson, MD   2 mg at 12/25/15 0846  . losartan (COZAAR) tablet 50 mg  50 mg Oral Daily Milagros LollSrikar Sudini, MD   50 mg  at 12/25/15 0845  . metoprolol tartrate (LOPRESSOR) tablet 25 mg  25 mg Oral BID Milagros Loll, MD   25 mg at 12/25/15 0845  . multivitamin with minerals tablet 1 tablet  1 tablet Oral Daily Milagros Loll, MD   1 tablet at 12/25/15 0845  . ondansetron (ZOFRAN) tablet 4 mg  4 mg Oral Q6H PRN Milagros Loll, MD       Or  . ondansetron (ZOFRAN) injection 4 mg  4 mg Intravenous Q6H PRN Srikar Sudini, MD      . oxyCODONE (Oxy IR/ROXICODONE) immediate release tablet 5 mg  5 mg Oral Q4H PRN Srikar Sudini, MD      . polyethylene glycol (MIRALAX / GLYCOLAX) packet 17 g  17 g Oral Daily PRN Srikar Sudini, MD      . prednisoLONE (ORAPRED) 15 MG/5ML solution 40 mg  40 mg Oral QAC breakfast Alford Highland, MD   40 mg at 12/25/15 0847  . rifaximin (XIFAXAN) tablet 550 mg  550 mg Oral BID Milagros Loll, MD   550 mg at 12/25/15 0845   . sodium chloride flush (NS) 0.9 % injection 3 mL  3 mL Intravenous Q12H Milagros Loll, MD   3 mL at 12/25/15 0848  . thiamine (VITAMIN B-1) tablet 100 mg  100 mg Oral Daily Milagros Loll, MD   100 mg at 12/25/15 0845     Discharge Medications: Please see discharge summary for a list of discharge medications.  Relevant Imaging Results:  Relevant Lab Results:   Additional Information SSN: [id number]  Andree Moro Work [phone]

## 2015-12-25 NOTE — Progress Notes (Signed)
Patient ID: Ryan PolioMichael J Klonowski, male   DOB: 07-20-1960, 55 y.o.   MRN: [id number]  [employer] PROGRESS NOTE  Ryan Howe [id number] DOB: 07-20-1960 DOA: 12/22/2015 [facility]  HPI/Subjective: Patient still confused. Asking to go home today and jump start his truck. Still very weak.  Objective: Vitals:   12/24/15 2030 12/25/15 0449  BP: 115/60 140/77  Pulse: 90 79  Resp:    Temp: 98.7 F (37.1 C) 98.8 F (37.1 C)    Filed Weights   12/22/15 2231 12/23/15 0500 12/25/15 0500  Weight: 80.3 kg (177 lb) 79.8 kg (176 lb) 84.6 kg (186 lb 6.4 oz)    ROS: Review of Systems  Constitutional: Negative for chills and fever.  Eyes: Negative for blurred vision.  Respiratory: Negative for cough and shortness of breath.   Cardiovascular: Negative for chest pain.  Gastrointestinal: Positive for abdominal pain and diarrhea. Negative for constipation, nausea and vomiting.  Genitourinary: Negative for dysuria.  Musculoskeletal: Negative for joint pain.  Neurological: Negative for dizziness and headaches.   Exam: Physical Exam  HENT:  Nose: No mucosal edema.  Mouth/Throat: No oropharyngeal exudate or posterior oropharyngeal edema.  Eyes: EOM and lids are normal. Pupils are equal, round, and reactive to light.  Neck: No JVD present. Carotid bruit is not present. No edema present. No thyroid mass and no thyromegaly present.  Cardiovascular: S1 normal and S2 normal.  Exam reveals no gallop.   No murmur heard. Pulses:      Dorsalis pedis pulses are 2+ on the right side, and 2+ on the left side.  Respiratory: No respiratory distress. He has no wheezes. He has no rhonchi. He has no rales.  GI: Soft. Bowel sounds are normal. There is no tenderness.  Musculoskeletal:       Right ankle: He exhibits no swelling.       Left ankle: He exhibits no swelling.  Positive tremor  Lymphadenopathy:    He has no cervical adenopathy.  Neurological: He is alert. No  cranial nerve deficit.  Skin: Skin is warm. No rash noted. Nails show no clubbing.  Psychiatric: He has a normal mood and affect.      Data Reviewed: Basic Metabolic Panel:  Recent Labs Lab 12/22/15 1700 12/23/15 0530 12/24/15 0457 12/25/15 0542  NA 140 137 138 136  K 4.7 3.5 3.9 3.4*  CL 102 105 107 108  CO2 21* 25 24 21*  GLUCOSE 167* 85 112* 106*  BUN 17 21* 26* 24*  CREATININE 1.51* 1.46* 1.27* 1.21  CALCIUM 9.7 9.0 9.5 9.7  MG  --  1.4*  --  1.5*   Liver Function Tests:  Recent Labs Lab 12/22/15 1700 12/23/15 0530 12/24/15 0457 12/25/15 0542  AST 143* 132* 128* 104*  ALT 38 34 35 38  ALKPHOS 223* 194* 216* 188*  BILITOT 4.5* 4.9* 4.3* 3.4*  PROT 9.3* 7.9 7.7 7.1  ALBUMIN 4.5 3.9 3.6 3.4*    Recent Labs Lab 12/22/15 1900 12/23/15 0530 12/25/15 0542  AMMONIA 45* 51* 59*   CBC:  Recent Labs Lab 12/22/15 1700 12/23/15 0530  WBC 5.0 7.6  NEUTROABS 4.2  --   HGB 11.0* 9.9*  HCT 31.6* 29.2*  MCV 93.0 93.8  PLT 67* 58*   Cardiac Enzymes:  Recent Labs Lab 12/22/15 1720  TROPONINI <0.03     Studies: No results found.  Scheduled Meds: . citalopram  10 mg Oral QHS  . feeding supplement (ENSURE ENLIVE)  237 mL  Oral BID BM  . folic acid  1 mg Oral Daily  . lactulose  30 g Oral BID  . LORazepam  0-4 mg Oral Q12H  . losartan  50 mg Oral Daily  . metoprolol tartrate  25 mg Oral BID  . multivitamin with minerals  1 tablet Oral Daily  . prednisoLONE  40 mg Oral QAC breakfast  . rifaximin  550 mg Oral BID  . sodium chloride flush  3 mL Intravenous Q12H  . thiamine  100 mg Oral Daily    Assessment/Plan:  1. Alcoholic hepatitis. Continue prednisolone 40 mg daily. Continue to watch liver function tests. 2. Alcohol abuse and withdrawal with hallulcinations. On CIWA protocol.  3. Hypomagnesemia replaced IV today 4. Signs of alcoholic cirrhosis seen with sonogram of abdomen,  coagulopathy, thrombocytopenia and alcoholic hepatitis. Patient was  advised that he must stop drinking. 5. Hepatic encephalopathy with confusion. Continue Xifaxan and lactulose. Still with some altered mentation at times. 6. Essential hypertension on losartan, metoprolol 7. Weakness. Patient may need rehabilitation secondary to weakness.  Code Status:     Code Status Orders        Start     Ordered   12/22/15 2030  Full code  Continuous     12/22/15 2030    Code Status History    Date Active Date Inactive Code Status Order ID Comments User Context   12/22/2015  4:27 PM 12/22/2015  8:30 PM Full Code [id number]  Willy Eddy, MD ED     Family Communication: Mother at the bedside Disposition Plan: Potential rehabilitation  Time spent: 27 minutes  Alford Highland  [employer]

## 2015-12-25 NOTE — Evaluation (Signed)
Physical Therapy Evaluation Patient Details Name: Cristine PolioMichael J Mccall MRN: [id number] DOB: 1960/09/05 Today's Date: 12/25/2015   History of Present Illness  Pt is a 55 y/o male presenting with dizziness, chills, vomiting, and weakness. Pt admitted due to liver failure (hepatic encephalopathy, alcoholic hepatitis without ascites). PMH of HTN, HLD, alcohol abuse, anxiety, and hypercholesteremia.  Clinical Impression  Pt lives in a 1 story home with his mother and was independent with all ADL's and mobility at baseline. Pt reports falling 3-4 times in the past 6 months (once in the tub/shower unit). Pt strength in the UE 's and LE 's are WFL's. Pt at this time needs supervision with bed mobility and +2 CGA to min assist with transfers and ambulating 90 ft for safety due to tremors/balance. Pt required mod/max vc's throughout session to maintain safety using RW and continues to need reinforcement. No dizziness/lightheadedness reported throughout session. Pt would benefit from STR at this time to improve balance and safe transfers/ambulation with decreased assistance (SW/CM notified).     Follow Up Recommendations SNF    Equipment Recommendations  Rolling walker with 5" wheels    Recommendations for Other Services       Precautions / Restrictions Precautions Precautions: Fall Restrictions Weight Bearing Restrictions: No      Mobility  Bed Mobility Overal bed mobility: Needs Assistance Bed Mobility: Supine to Sit     Supine to sit: Supervision     General bed mobility comments: Requires increased time; min cuing for feet placement  Transfers Overall transfer level: Needs assistance Equipment used: Rolling walker (2 wheeled) Transfers: Sit to/from Stand Sit to Stand: Min guard;Min assist;+2 physical assistance         General transfer comment: Pt unsteady; no dizziness or lighheadedness reported; needs further education on hand placement for transfer using a  RW  Ambulation/Gait Ambulation/Gait assistance: Min guard;Min assist;+2 physical assistance Ambulation Distance (Feet): 90 Feet Assistive device: Rolling walker (2 wheeled) Gait Pattern/deviations: Narrow base of support;Staggering left;Step-through pattern Gait velocity: decreased (cadence increased towards the end)   General Gait Details: Pt requires mod/max cuing throughout session to stay within RW and steering the RW when turning; pt reports feeling weak and "trembly" in B knees  Stairs            Wheelchair Mobility    Modified Rankin (Stroke Patients Only)       Balance Overall balance assessment: Needs assistance;History of Falls (3-4 falls in the past 6 months (per pt/pt mother)) Sitting-balance support: Feet supported Sitting balance-Leahy Scale: Good Sitting balance - Comments: Pt able to maintain and correct posture through MMT   Standing balance support: Bilateral upper extremity supported Standing balance-Leahy Scale: Poor Standing balance comment: Pt requires +2 min assist to control staggering and prevent LOB                             Pertinent Vitals/Pain Pain Assessment: No/denies pain    Home Living Family/patient expects to be discharged to:: Private residence Living Arrangements: Parent (mother) Available Help at Discharge: Family Type of Home: House       Home Layout: One level Home Equipment: None      Prior Function Level of Independence: Independent               Hand Dominance   Dominant Hand: Right    Extremity/Trunk Assessment   Upper Extremity Assessment: Overall WFL for tasks assessed (B shoulder flexion and elbow  flexion 4/5; B elbow extension grossly 3/5; tremors with use)           Lower Extremity Assessment: Overall WFL for tasks assessed (B hip flexion and knee extension 4+/5; tremors with activity)         Communication   Communication: No difficulties  Cognition Arousal/Alertness:  Awake/alert Behavior During Therapy: Restless (Agitated on phone at beginning of session; WFLs with PT; restless at end of session) Overall Cognitive Status: Within Functional Limits for tasks assessed                      General Comments General comments (skin integrity, edema, etc.): Pt agreeable to PT session. HR and O2 monitored throughout session without concern.     Exercises     Assessment/Plan    PT Assessment Patient needs continued PT services  PT Problem List Decreased activity tolerance;Decreased balance;Decreased mobility;Decreased knowledge of use of DME;Decreased safety awareness          PT Treatment Interventions DME instruction;Gait training;Stair training;Therapeutic activities;Balance training;Patient/family education    PT Goals (Current goals can be found in the Care Plan section)  Acute Rehab PT Goals Patient Stated Goal: To go home and to feel less weak. PT Goal Formulation: With patient Time For Goal Achievement: 01/08/16 Potential to Achieve Goals: Fair    Frequency Min 2X/week   Barriers to discharge        Co-evaluation               End of Session Equipment Utilized During Treatment: Gait belt Activity Tolerance: Patient limited by fatigue Patient left: in chair;with call bell/phone within reach;with chair alarm set Nurse Communication: Mobility status;Precautions         Time: 0981-1914 PT Time Calculation (min) (ACUTE ONLY): 32 min   Charges:         PT G Codes:        Albertina Senegal, SPT 12/25/2015, 12:17 PM

## 2015-12-25 NOTE — Clinical Social Work Note (Addendum)
Clinical Social Work Assessment  Patient Details  Name: Ryan Howe MRN: [id number] Date of Birth: Aug 16, 1960  Date of referral:  12/25/15               Reason for consult:  Discharge Planning                Permission sought to share information with:  Family Supports Permission granted to share information::  Yes, Verbal Permission Granted  Name::        Agency::     Relationship::   (Mother)  Contact Information:     Housing/Transportation Living arrangements for the past 2 months:  Single Family Home Source of Information:  Patient Patient Interpreter Needed:  None Criminal Activity/Legal Involvement Pertinent to Current Situation/Hospitalization:  No - Comment as needed Significant Relationships:  Parents Lives with:  Spouse Do you feel safe going back to the place where you live?  Yes Need for family participation in patient care:  No (Coment)  Care giving concerns: PT recommended that patient would benefit from SNF placement.    Social Worker assessment / plan:  CSW and CSW intern met with patient at bedside. Introduced herself and her role. Patient and his mother were in the room. Permission granted to speak to his mother about STR placment. Per patient he lives a home with his "elderly mother". 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 Reported her rings home about $1,500 a month. Reported that he makes $2.13 an hour. CSW asked patient if he could afford to privately pay for STR. Per patient he's not able to afford STR. Reported that he would like to go home at discharge but he'd consider SNF. Granted CSW verbal permission to send SNF referral to SNFs. Understood that he may have to go outside of [facility] for STR if a payor source was found. Stated he'd rather go home. CSW spoke to CSW Surveyor, quantity and presented patient to determine if he's eligible for a LOG. After reviewing patient's chart with CSW Surveyor, quantity it was deemed that patient is not eligible for  an LOG. CSW informed RNCM of above. CSW informed patient. CSW is signing off but is available if a need were to arise.   Employment status:  Psychologist, counselling:  Medicare PT Recommendations:  [place] / Referral to community resources:  [place]  Patient/Family's Response to care:  Patient reported that he'd like to return home with possible [place] services. RNCM is aware.   Patient/Family's Understanding of and Emotional Response to Diagnosis, Current Treatment, and Prognosis:  Patient reports that he understands his Diagnosis, Current Treatment, and Prognosis.   Emotional Assessment Appearance:  Appears stated age Attitude/Demeanor/Rapport:   (None) Affect (typically observed):  Calm, Pleasant Orientation:  Oriented to Self, Oriented to Place, Oriented to  Time, Oriented to Situation Alcohol / Substance use:  Not Applicable Psych involvement (Current and /or in the community):  No (Comment)  Discharge Needs  Concerns to be addressed:  Discharge Planning Concerns Readmission within the last 30 days:  No Current discharge risk:  Chronically ill Barriers to Discharge:  Continued Medical Work up   [employer], LCSW 12/25/2015, 2:53 PM

## 2015-12-26 LAB — COMPREHENSIVE METABOLIC PANEL
ALBUMIN: 3.2 g/dL — AB (ref 3.5–5.0)
ALK PHOS: 168 U/L — AB (ref 38–126)
ALT: 40 U/L (ref 17–63)
ANION GAP: 6 (ref 5–15)
AST: 96 U/L — AB (ref 15–41)
BILIRUBIN TOTAL: 2.5 mg/dL — AB (ref 0.3–1.2)
BUN: 21 mg/dL — AB (ref 6–20)
CALCIUM: 8.8 mg/dL — AB (ref 8.9–10.3)
CO2: 20 mmol/L — ABNORMAL LOW (ref 22–32)
Chloride: 113 mmol/L — ABNORMAL HIGH (ref 101–111)
Creatinine, Ser: 1.03 mg/dL (ref 0.61–1.24)
GFR calc Af Amer: >60 mL/min (ref >60)
GLUCOSE: 116 mg/dL — AB (ref 65–99)
Potassium: 3.6 mmol/L (ref 3.5–5.1)
Sodium: 139 mmol/L (ref 135–145)
TOTAL PROTEIN: 6.6 g/dL (ref 6.5–8.1)

## 2015-12-26 LAB — CBC
HEMATOCRIT: 26.7 % — AB (ref 40.0–52.0)
HEMOGLOBIN: 9.1 g/dL — AB (ref 13.0–18.0)
MCH: 32.7 pg (ref 26.0–34.0)
MCHC: 34 g/dL (ref 32.0–36.0)
MCV: 96.2 fL (ref 80.0–100.0)
Platelets: 86 10*3/uL — ABNORMAL LOW (ref 150–440)
RBC: 2.77 MIL/uL — ABNORMAL LOW (ref 4.40–5.90)
RDW: 15.3 % — ABNORMAL HIGH (ref 11.5–14.5)
WBC: 8.8 10*3/uL (ref 3.8–10.6)

## 2015-12-26 MED ORDER — LACTULOSE 10 GM/15ML PO SOLN: 30.0000 g | Freq: Two times a day (BID) | ORAL | 0 refills | Status: DC

## 2015-12-26 MED ORDER — PREDNISOLONE SODIUM PHOSPHATE 15 MG/5ML PO SOLN: ORAL | 0 refills | Status: DC

## 2015-12-26 MED ORDER — METOPROLOL TARTRATE 25 MG PO TABS: 25.0000 mg | ORAL_TABLET | Freq: Two times a day (BID) | ORAL | 0 refills | Status: DC

## 2015-12-26 MED ORDER — CITALOPRAM HYDROBROMIDE 10 MG PO TABS: 10.0000 mg | ORAL_TABLET | Freq: Every day | ORAL | 0 refills | Status: DC

## 2015-12-26 MED ORDER — ENSURE ENLIVE PO LIQD: 237.0000 mL | Freq: Two times a day (BID) | ORAL | 0 refills | Status: DC

## 2015-12-26 MED ORDER — ADULT MULTIVITAMIN W/MINERALS CH
1.0000 | ORAL_TABLET | Freq: Every day | ORAL | 0 refills | Status: DC
Start: 2015-12-27 — End: 2016-05-04

## 2015-12-26 MED ORDER — FOLIC ACID 1 MG PO TABS: 1.0000 mg | ORAL_TABLET | Freq: Every day | ORAL | 0 refills | Status: DC

## 2015-12-26 NOTE — Discharge Summary (Signed)
[employer] [facility] - [place] at [facility]   PATIENT NAME: Ryan Howe    MR#:  [id number]  DATE OF BIRTH:  January 11, 1961  DATE OF ADMISSION:  12/22/2015 ADMITTING PHYSICIAN: Milagros Loll, MD  DATE OF DISCHARGE: 12/26/2015  PRIMARY CARE PHYSICIAN: [facility]    ADMISSION DIAGNOSIS:  Hepatic encephalopathy (HCC) [K72.90] Hepatitis [K75.9] Jaundice [R17] Alcoholic hepatitis without ascites [K70.10]  DISCHARGE DIAGNOSIS:  Active Problems:   Liver failure (HCC)   SECONDARY DIAGNOSIS:   Past Medical History:  Diagnosis Date  . Anxiety   . Hypercholesteremia   . Hypertension     HOSPITAL COURSE:   1. Alcoholic hepatitis. Continue prednisolone for total of 28 days then taper to off. Liver function tests trending in the right direction. The further away from alcohol the better they will get. Bilirubin is down to 2.5. It peaked at 4.9. 2. Alcohol abuse and withdrawals with hallucinations. Prior to discharge she did not receive any Ativan. 3. Hypomagnesemia. This was replaced during the hospital course. 4. 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 Patient also has coagulopathy and thrombocytopenia and alcohol Hepatitis. Patient advised he must stop drinking. If he ever once a liver transplant he needs to stop drinking for at least 6 months and follow-up at the major center. Thrombocytopenia improving the further away from alcohol he gets. 5. Hepatic encephalopathy with confusion. Patient will not be able to afford Xifaxan. Continue lactulose twice a day. 6. Essential hypertension continue metoprolol 7. Weakness. Patient does not have insurance so he cannot go to rehabilitation. Unable to set up home health. Walker prescribed. I advised no driving. Mother at the bedside.  DISCHARGE CONDITIONS:   Fair  CONSULTS OBTAINED:  Treatment Team:  Scot Jun, MD  DRUG ALLERGIES:  No Known Allergies  DISCHARGE MEDICATIONS:   Current  Discharge Medication List    START taking these medications   Details  feeding supplement, ENSURE ENLIVE, (ENSURE ENLIVE) LIQD Take 237 mLs by mouth 2 (two) times daily between meals. Qty: 60 Bottle, Refills: 0    folic acid (FOLVITE) 1 MG tablet Take 1 tablet (1 mg total) by mouth daily. Qty: 30 tablet, Refills: 0    lactulose (CHRONULAC) 10 GM/15ML solution Take 45 mLs (30 g total) by mouth 2 (two) times daily. Qty: 1892 mL, Refills: 0    metoprolol tartrate (LOPRESSOR) 25 MG tablet Take 1 tablet (25 mg total) by mouth 2 (two) times daily. Qty: 60 tablet, Refills: 0    Multiple Vitamin (MULTIVITAMIN WITH MINERALS) TABS tablet Take 1 tablet by mouth daily. Qty: 30 tablet, Refills: 0    prednisoLONE (ORAPRED) 15 MG/5ML solution 40mg  (13.48ml)po daily for 25 days, then 30mg  (10ml) po daily for two days; then 20 (5.24ml)mg daily for two days; then 10 mg (3.63ml)po daily for two days then 5 mg (1.29ml)po daily for two days Qty: 375 mL, Refills: 0      CONTINUE these medications which have CHANGED   Details  citalopram (CELEXA) 10 MG tablet Take 1 tablet (10 mg total) by mouth at bedtime. Qty: 30 tablet, Refills: 0      STOP taking these medications     losartan-hydrochlorothiazide (HYZAAR) 50-12.5 MG tablet      lovastatin (MEVACOR) 40 MG tablet          DISCHARGE INSTRUCTIONS:   [facility]  If you experience worsening of your admission symptoms, develop shortness of breath, life threatening emergency, suicidal or homicidal thoughts you must  seek medical attention immediately by calling 911 or calling your MD immediately  if symptoms less severe.  You Must read complete instructions/literature along with all the possible adverse reactions/side effects for all the Medicines you take and that have been prescribed to you. Take any new Medicines after you have completely understood and accept all the possible adverse reactions/side effects.   Please note  You  were cared for by a hospitalist during your hospital stay. If you have any questions about your discharge medications or the care you received while you were in the hospital after you are discharged, you can call the unit and asked to speak with the hospitalist on call if the hospitalist that took care of you is not available. Once you are discharged, your primary care physician will handle any further medical issues. Please note that NO REFILLS for any discharge medications will be authorized once you are discharged, as it is imperative that you return to your primary care physician (or establish a relationship with a primary care physician if you do not have one) for your aftercare needs so that they can reassess your need for medications and monitor your lab values.    Today   CHIEF COMPLAINT:   Chief Complaint  Patient presents with  . Dizziness  . Chills  . Emesis    HISTORY OF PRESENT ILLNESS:  Ryan Howe  is a 55 y.o. male with a known history of Alcoholism presents with dizziness chills and emesis   VITAL SIGNS:  Blood pressure (!) 144/74, pulse 72, temperature 99 F (37.2 C), temperature source Oral, resp. rate 20, height 6' (1.829 m), weight 84.7 kg (186 lb 11.2 oz), SpO2 96 %.    PHYSICAL EXAMINATION:  GENERAL:  55 y.o.-year-old patient lying in the bed with no acute distress.  EYES: Pupils equal, round, reactive to light and accommodation. No scleral icterus. Extraocular muscles intact.  HEENT: Head atraumatic, normocephalic. Oropharynx and nasopharynx clear.  NECK:  Supple, no jugular venous distention. No thyroid enlargement, no tenderness.  LUNGS: Normal breath sounds bilaterally, no wheezing, rales,rhonchi or crepitation. No use of accessory muscles of respiration.  CARDIOVASCULAR: S1, S2 normal. No murmurs, rubs, or gallops.  ABDOMEN: Soft, non-tender, non-distended. Bowel sounds present. No organomegaly or mass.  EXTREMITIES: Trace edema, no cyanosis, or  clubbing.  NEUROLOGIC: Cranial nerves II through XII are intact.  PSYCHIATRIC: The patient is alert.  SKIN: No obvious rash, lesion, or ulcer.   DATA REVIEW:   CBC  Recent Labs Lab 12/26/15 0533  WBC 8.8  HGB 9.1*  HCT 26.7*  PLT 86*    Chemistries   Recent Labs Lab 12/25/15 0542 12/26/15 0533  NA 136 139  K 3.4* 3.6  CL 108 113*  CO2 21* 20*  GLUCOSE 106* 116*  BUN 24* 21*  CREATININE 1.21 1.03  CALCIUM 9.7 8.8*  MG 1.5*  --   AST 104* 96*  ALT 38 40  ALKPHOS 188* 168*  BILITOT 3.4* 2.5*    Cardiac Enzymes  Recent Labs Lab 12/22/15 1720  TROPONINI <0.03      Management plans discussed with the patient, family and they are in agreement.  CODE STATUS:     Code Status Orders        Start     Ordered   12/22/15 2030  Full code  Continuous     12/22/15 2030    Code Status History    Date Active Date Inactive Code Status Order ID Comments User  Context   12/22/2015  4:27 PM 12/22/2015  8:30 PM Full Code [id number]  Willy Eddy, MD ED      TOTAL TIME TAKING CARE OF THIS PATIENT: 35 minutes.    Alford Highland M.D on 12/26/2015 at 2:55 PM  Between 7am to 6pm - Pager - [phone]  After 6pm go to [email or web address] - Social research officer, government  [employer] [facility] Office  [phone]  CC: Primary care physician; [facility]

## 2015-12-26 NOTE — Progress Notes (Signed)
CSW spoke to patient's mother. CSW informed her that here isn't a payer source for SNF placement. Informed her that if they cannot afford to privately pay for STR then patient will have to discharge home. Informed MD of above. CSW is signing off but is available if a ned were to arise.  Woodroe Modehristina Decoda Van, MSW, LCSW, LCAS-A Clinical Social Worker [phone]

## 2015-12-26 NOTE — Progress Notes (Signed)
CSW represented patient to CSW ChiropodistAssistant Director. He reported that patient still isn't eligible for a LOG.  Woodroe Modehristina Hawthorne Day, MSW, LCSW, LCAS-A Clinical Social Worker [phone]

## 2015-12-26 NOTE — Consult Note (Signed)
Pt improving, I discussed with Dr. Renae GlossWieting and the patients labs are improving, still some reported confusion.  I will sign off.  Recommend treat at current dose of prednisone for 2 weeks and taper over 2 weeks, would give empiric PPI.

## 2015-12-26 NOTE — Care Management (Signed)
Discharge to home today per Dr. Renae GlossWieting. Rolling walker will be given per [facility]. Unable to provide physical therapy in the home. Will [facility] information Gwenette GreetBrenda S Woodrow Dulski RN MSN CCM Care Management [phone]

## 2015-12-26 NOTE — Progress Notes (Signed)
Pt being discharged home with walker, mother at bedside, discharge instructions and prescriptions reviewed with pt and mother, pt with no complaints, no distress or discomfort noted, wallet returned to pt from hospital safe by security

## 2015-12-26 NOTE — Discharge Instructions (Signed)
Alcoholic Hepatitis °Alcoholic hepatitis is liver inflammation caused by drinking alcohol. This inflammation decreases the liver's ability to function normally.  °CAUSES  °Alcoholic hepatitis is caused by heavy drinking.  °RISK FACTORS °You may have an increased risk of alcoholic hepatitis if:  °· You drink large amounts of alcohol. °· You have been drinking heavily for years. °· You binge drink. °· You are male. °· You are obese. °· You have had infectious hepatitis. °· You are malnourished. °· You have close family members who have had alcoholic hepatitis. °SIGNS AND SYMPTOMS °· Abdominal pain. °· A swollen abdomen. °· Loss of appetite. °· Unintentional weight loss. °· Nausea and vomiting. °· Tiredness. °· Dry mouth. °· Severe thirst. °· A yellow tone to the skin and whites of the eyes (jaundice). °· Spidery veins, especially across the skin of the abdomen. °· Unusual bleeding. °· Itching. °· Trouble thinking clearly. °· Memory problems. °· Mood changes. °· Confusion. °· Numbness and tingling in the feet and legs. °DIAGNOSIS  °Alcoholic hepatitis is diagnosed with blood tests that show problems with liver function. Additional tests may also be done, such as: °· An ultrasound. °· A CT scan. °· An MRI scan. °· A liver biopsy. For this test, a small sample of the liver will be taken and examined for evidence of liver damage. °TREATMENT °The most important step you can take to treat alcoholic hepatitis is to stop drinking alcohol. If you are addicted to alcohol, your health care provider will help you create a plan to quit. It may involve: °· Taking medicine to decrease withdrawal symptoms. °· Entering a program to help you stop drinking. °· Joining a support group. °Additional treatment for alcoholic hepatitis may include: °· Medicines such as steroids. The medicines will help decrease the inflammation. °· Diet. Your health care provider might ask you to undergo nutritional counseling and follow a diet. You may  also need to take dietary supplements and vitamins. °· A liver transplant. This procedure is performed in very severe cases. It is only performed on people who have totally stopped drinking and can commit to never drinking alcohol again. °HOME CARE INSTRUCTIONS °· Do not drink alcohol. °· Do not use medicines or eat foods that contain alcohol. °· Take medicines only as directed by your health care provider. °· Follow dietary instructions carefully. °· Keep all follow-up visits as directed by your health care provider. This is important. °SEEK MEDICAL CARE IF: °· You have a fever. °· You do not have your usual appetite. °· You have flu-like symptoms such as fatigue, weakness, or muscle aches. °· You feel nauseous or vomit. °· You bruise easily. °· Your urine is very dark. °· You have new abdominal pain. °SEEK IMMEDIATE MEDICAL CARE IF: °· There is blood in your vomit. °· You develop jaundice. °· Your skin itches severely. °· Your legs swell. °· Your stomach appears bloated. °· You have black, tarry-appearing stools. °· You bleed easily. °· You are confused or not thinking clearly. °· You have a seizure. °MAKE SURE YOU: °· Understand these instructions. °· Will watch your condition. °· Will get help right away if you are not doing well or get worse. °  °This information is not intended to replace advice given to you by your health care provider. Make sure you discuss any questions you have with your health care provider. °  °Document Released: 10/17/2013 Document Reviewed: 10/17/2013 °Elsevier Interactive Patient Education ©2016 [employer] Inc[employer] ° °

## 2015-12-26 NOTE — Progress Notes (Signed)
Physical Therapy Treatment Patient Details Name: Ryan Howe MRN: [id number] DOB: 1960/12/14 Today's Date: 12/26/2015    History of Present Illness Pt is a 55 y/o male presenting with dizziness, chills, vomiting, and weakness. Pt admitted due to liver failure (hepatic encephalopathy, alcoholic hepatitis without ascites). PMH of HTN, HLD, alcohol abuse, anxiety, and hypercholesteremia    PT Comments    Pt demonstrated the continued need for +2 CGA to min assist with challenged static balance. Pt is independent with bed mobility. Pt required + 1 CGA to min assist plus min vc's with transfers and ambulation using a RW (pt unable to safely ambulate with a small base quad cane at this time). At end of session pt and pt mother educated on the use of a shower seat to bath in order to increase safety and decrease risk of fall due to balance concerns, reinforcement of safe use of RW , and current assist needed ambulating which pt mother reports that she is unable to provide. Pt mother reported concern with pt driving; PT educated pt and pt mother that the LE tremors may decrease reaction time in driving, making it an unsafe situation. Pt will benefit from continued physical therapy to improve balance and increased independence with functional mobility.    Follow Up Recommendations  SNF     Equipment Recommendations  Rolling walker with 5" wheels    Recommendations for Other Services       Precautions / Restrictions Precautions Precautions: Fall Restrictions Weight Bearing Restrictions: No    Mobility  Bed Mobility Overal bed mobility: Independent Bed Mobility: Supine to Sit     Supine to sit: Independent (Pt impulsive and quick; min vc's to maintain sitting only)        Transfers Overall transfer level: Needs assistance Equipment used: None Transfers: Sit to/from Stand Sit to Stand: Min assist;+2 physical assistance;Min guard         General transfer comment: B LE  tremors; +2 CGA to min assist to maintain balance   Ambulation/Gait Ambulation/Gait assistance: Min guard;Min assist Ambulation Distance (Feet):  (135' (110' with small base quad cane; 25' with RW; standing rest break); 7665' (to bed with RW)) Assistive device: Rolling walker (2 wheeled);Quad cane Gait Pattern/deviations: Step-through pattern;Wide base of support Gait velocity: increased (min/mod vc's to slow down due to increased speed presented with increased unsteadiness/decreased safety)   General Gait Details: Pt required min vc's to maintian within RW (education provided to enforce this and to keep RW with him at all times until seated); Pt was educated on proper use for quad-cane and assessed for gait (pt required +1 min/mod assist and max vc's for proper use with decreased safety awareness; pt returned to RW and pt reported "this does feel better".)     Stairs            Wheelchair Mobility    Modified Rankin (Stroke Patients Only)       Balance Overall balance assessment: Needs assistance;History of Falls Sitting-balance support: Feet supported Sitting balance-Leahy Scale: Good (Pt able to bend forward to put shoes on without LOB)       Standing balance-Leahy Scale: Poor Standing balance comment: Pt able +2 CGA to min assist with 20 seconds of each of the following stances: feet together, partial tandem (patient unable to get feet together), and eyes closed (greatest perturbations, min vc's to not use bed as support); dynamic balance required +1 CGA to min assist using RW  Cognition Arousal/Alertness: Awake/alert Behavior During Therapy: WFL for tasks assessed/performed Overall Cognitive Status: Within Functional Limits for tasks assessed                      Exercises      General Comments General comments (skin integrity, edema, etc.): Pt agreeable to PT session. No reports of lightheadedness or dizziness throughout session. HR  and O2 monitored throughout session without  concern.       Pertinent Vitals/Pain Pain Assessment: No/denies pain; Throughout session O2 sats >95%; HR at rest 80 bpm and with activity HR 86.     Home Living                      Prior Function            PT Goals (current goals can now be found in the care plan section) Acute Rehab PT Goals Patient Stated Goal: to go home. PT Goal Formulation: With patient Time For Goal Achievement: 01/08/16 Potential to Achieve Goals: Fair Progress towards PT goals: Progressing toward goals    Frequency    Min 2X/week      PT Plan Current plan remains appropriate    Co-evaluation             End of Session Equipment Utilized During Treatment: Gait belt Activity Tolerance: Patient tolerated treatment well Patient left: in bed;with call bell/phone within reach;with bed alarm set;with family/visitor present     Time: 9528-4132 PT Time Calculation (min) (ACUTE ONLY): 21 min  Charges:                       G CodesAlbertina Senegal, SPT 12/26/2015, 12:15 PM

## 2016-01-11 ENCOUNTER — Encounter: Payer: Self-pay | Admitting: Emergency Medicine

## 2016-01-11 ENCOUNTER — Emergency Department: Payer: Self-pay

## 2016-01-11 ENCOUNTER — Inpatient Hospital Stay
Admission: EM | Admit: 2016-01-11 | Discharge: 2016-01-13 | DRG: 308 | Disposition: A | Discharge status: Home / Self Care | Payer: Self-pay | Attending: Internal Medicine | Admitting: Internal Medicine

## 2016-01-11 DIAGNOSIS — I110 Hypertensive heart disease with heart failure: Secondary | ICD-10-CM | POA: Diagnosis present

## 2016-01-11 DIAGNOSIS — R7989 Other specified abnormal findings of blood chemistry: Secondary | ICD-10-CM

## 2016-01-11 DIAGNOSIS — I509 Heart failure, unspecified: Secondary | ICD-10-CM

## 2016-01-11 DIAGNOSIS — E876 Hypokalemia: Secondary | ICD-10-CM

## 2016-01-11 DIAGNOSIS — D638 Anemia in other chronic diseases classified elsewhere: Secondary | ICD-10-CM | POA: Diagnosis present

## 2016-01-11 DIAGNOSIS — M542 Cervicalgia: Secondary | ICD-10-CM | POA: Diagnosis present

## 2016-01-11 DIAGNOSIS — Z79899 Other long term (current) drug therapy: Secondary | ICD-10-CM

## 2016-01-11 DIAGNOSIS — E7800 Pure hypercholesterolemia, unspecified: Secondary | ICD-10-CM | POA: Diagnosis present

## 2016-01-11 DIAGNOSIS — D696 Thrombocytopenia, unspecified: Secondary | ICD-10-CM

## 2016-01-11 DIAGNOSIS — I471 Supraventricular tachycardia: Principal | ICD-10-CM | POA: Diagnosis present

## 2016-01-11 DIAGNOSIS — K7290 Hepatic failure, unspecified without coma: Secondary | ICD-10-CM

## 2016-01-11 DIAGNOSIS — K7200 Acute and subacute hepatic failure without coma: Secondary | ICD-10-CM | POA: Diagnosis present

## 2016-01-11 DIAGNOSIS — K7460 Unspecified cirrhosis of liver: Secondary | ICD-10-CM

## 2016-01-11 DIAGNOSIS — M79606 Pain in leg, unspecified: Secondary | ICD-10-CM | POA: Diagnosis present

## 2016-01-11 DIAGNOSIS — E785 Hyperlipidemia, unspecified: Secondary | ICD-10-CM | POA: Insufficient documentation

## 2016-01-11 DIAGNOSIS — F419 Anxiety disorder, unspecified: Secondary | ICD-10-CM | POA: Diagnosis present

## 2016-01-11 DIAGNOSIS — I10 Essential (primary) hypertension: Secondary | ICD-10-CM | POA: Diagnosis present

## 2016-01-11 DIAGNOSIS — R601 Generalized edema: Secondary | ICD-10-CM

## 2016-01-11 DIAGNOSIS — I5033 Acute on chronic diastolic (congestive) heart failure: Secondary | ICD-10-CM | POA: Diagnosis present

## 2016-01-11 DIAGNOSIS — K625 Hemorrhage of anus and rectum: Secondary | ICD-10-CM | POA: Diagnosis present

## 2016-01-11 DIAGNOSIS — R778 Other specified abnormalities of plasma proteins: Secondary | ICD-10-CM

## 2016-01-11 DIAGNOSIS — Z87891 Personal history of nicotine dependence: Secondary | ICD-10-CM

## 2016-01-11 DIAGNOSIS — R188 Other ascites: Secondary | ICD-10-CM

## 2016-01-11 DIAGNOSIS — K7031 Alcoholic cirrhosis of liver with ascites: Secondary | ICD-10-CM | POA: Diagnosis present

## 2016-01-11 DIAGNOSIS — F1010 Alcohol abuse, uncomplicated: Secondary | ICD-10-CM | POA: Diagnosis present

## 2016-01-11 DIAGNOSIS — R0600 Dyspnea, unspecified: Secondary | ICD-10-CM

## 2016-01-11 DIAGNOSIS — I4719 Other supraventricular tachycardia: Secondary | ICD-10-CM

## 2016-01-11 HISTORY — DX: Other psychoactive substance abuse, uncomplicated: F19.10

## 2016-01-11 HISTORY — DX: Hepatic failure, unspecified without coma: K72.90

## 2016-01-11 HISTORY — DX: Supraventricular tachycardia: I47.1

## 2016-01-11 HISTORY — DX: Unspecified cirrhosis of liver: K74.60

## 2016-01-11 HISTORY — DX: Acute pancreatitis without necrosis or infection, unspecified: K85.90

## 2016-01-11 HISTORY — DX: Anemia in other chronic diseases classified elsewhere: D63.8

## 2016-01-11 HISTORY — DX: Thrombocytopenia, unspecified: D69.6

## 2016-01-11 HISTORY — DX: Other supraventricular tachycardia: I47.19

## 2016-01-11 LAB — COMPREHENSIVE METABOLIC PANEL
ALK PHOS: 152 U/L — AB (ref 38–126)
ALT: 91 U/L — ABNORMAL HIGH (ref 17–63)
ANION GAP: 10 (ref 5–15)
AST: 136 U/L — ABNORMAL HIGH (ref 15–41)
Albumin: 3.4 g/dL — ABNORMAL LOW (ref 3.5–5.0)
BUN: 16 mg/dL (ref 6–20)
CALCIUM: 8 mg/dL — AB (ref 8.9–10.3)
CO2: 23 mmol/L (ref 22–32)
Chloride: 103 mmol/L (ref 101–111)
Creatinine, Ser: 0.96 mg/dL (ref 0.61–1.24)
GFR calc non Af Amer: >60 mL/min (ref >60)
Glucose, Bld: 104 mg/dL — ABNORMAL HIGH (ref 65–99)
Potassium: 3.1 mmol/L — ABNORMAL LOW (ref 3.5–5.1)
SODIUM: 136 mmol/L (ref 135–145)
Total Bilirubin: 4.4 mg/dL — ABNORMAL HIGH (ref 0.3–1.2)
Total Protein: 6.8 g/dL (ref 6.5–8.1)

## 2016-01-11 LAB — CBC
HEMATOCRIT: 25.7 % — AB (ref 40.0–52.0)
Hemoglobin: 8.6 g/dL — ABNORMAL LOW (ref 13.0–18.0)
MCH: 32.4 pg (ref 26.0–34.0)
MCHC: 33.4 g/dL (ref 32.0–36.0)
MCV: 97.1 fL (ref 80.0–100.0)
Platelets: 76 10*3/uL — ABNORMAL LOW (ref 150–440)
RBC: 2.64 MIL/uL — ABNORMAL LOW (ref 4.40–5.90)
RDW: 20.7 % — AB (ref 11.5–14.5)
WBC: 11.3 10*3/uL — ABNORMAL HIGH (ref 3.8–10.6)

## 2016-01-11 LAB — TROPONIN I: TROPONIN I: 0.04 ng/mL — AB (ref <0.03)

## 2016-01-11 LAB — AMMONIA: AMMONIA: 25 umol/L (ref 9–35)

## 2016-01-11 LAB — BRAIN NATRIURETIC PEPTIDE: B Natriuretic Peptide: 439 pg/mL — ABNORMAL HIGH (ref 0.0–100.0)

## 2016-01-11 MED ORDER — FUROSEMIDE 10 MG/ML IJ SOLN
40.0000 mg | Freq: Once | INTRAMUSCULAR | Status: AC
  Administered 2016-01-12: 40 mg via INTRAVENOUS
  Filled 2016-01-11: qty 4

## 2016-01-11 MED ORDER — FUROSEMIDE 10 MG/ML IJ SOLN
40.0000 mg | Freq: Once | INTRAMUSCULAR | Status: AC
  Administered 2016-01-11: 40 mg via INTRAVENOUS
  Filled 2016-01-11: qty 4

## 2016-01-11 MED ORDER — LORAZEPAM 2 MG/ML IJ SOLN: 1.0000 mg | Freq: Four times a day (QID) | INTRAMUSCULAR | Status: DC | PRN

## 2016-01-11 MED ORDER — LORAZEPAM 2 MG PO TABS: 0.0000 mg | ORAL_TABLET | Freq: Two times a day (BID) | ORAL | Status: DC

## 2016-01-11 MED ORDER — VITAMIN B-1 100 MG PO TABS
100.0000 mg | ORAL_TABLET | Freq: Every day | ORAL | Status: DC
  Administered 2016-01-12 – 2016-01-13 (×2): 100 mg via ORAL
  Filled 2016-01-11 (×2): qty 1

## 2016-01-11 MED ORDER — ONDANSETRON HCL 4 MG/2ML IJ SOLN: 4.0000 mg | Freq: Four times a day (QID) | INTRAMUSCULAR | Status: DC | PRN

## 2016-01-11 MED ORDER — ADULT MULTIVITAMIN W/MINERALS CH
1.0000 | ORAL_TABLET | Freq: Every day | ORAL | Status: DC
  Administered 2016-01-12 – 2016-01-13 (×2): 1 via ORAL
  Filled 2016-01-11 (×2): qty 1

## 2016-01-11 MED ORDER — LACTULOSE 10 GM/15ML PO SOLN
30.0000 g | Freq: Two times a day (BID) | ORAL | Status: DC
  Administered 2016-01-12 – 2016-01-13 (×4): 30 g via ORAL
  Filled 2016-01-11 (×4): qty 60

## 2016-01-11 MED ORDER — LORAZEPAM 2 MG PO TABS
0.0000 mg | ORAL_TABLET | Freq: Four times a day (QID) | ORAL | Status: DC
  Administered 2016-01-12 (×2): 1 mg via ORAL

## 2016-01-11 MED ORDER — LORAZEPAM 2 MG/ML IJ SOLN: 1.0000 mg | Freq: Once | INTRAMUSCULAR | Status: DC

## 2016-01-11 MED ORDER — LORAZEPAM 1 MG PO TABS
1.0000 mg | ORAL_TABLET | Freq: Four times a day (QID) | ORAL | Status: DC | PRN
  Administered 2016-01-11 – 2016-01-12 (×3): 1 mg via ORAL
  Filled 2016-01-11 (×3): qty 1

## 2016-01-11 MED ORDER — ONDANSETRON HCL 4 MG PO TABS: 4.0000 mg | ORAL_TABLET | Freq: Four times a day (QID) | ORAL | Status: DC | PRN

## 2016-01-11 MED ORDER — CITALOPRAM HYDROBROMIDE 20 MG PO TABS
10.0000 mg | ORAL_TABLET | Freq: Every day | ORAL | Status: DC
  Administered 2016-01-12 (×2): 10 mg via ORAL
  Filled 2016-01-11 (×2): qty 1

## 2016-01-11 MED ORDER — METOPROLOL TARTRATE 25 MG PO TABS
25.0000 mg | ORAL_TABLET | Freq: Two times a day (BID) | ORAL | Status: DC
  Administered 2016-01-12 – 2016-01-13 (×4): 25 mg via ORAL
  Filled 2016-01-11 (×4): qty 1

## 2016-01-11 MED ORDER — THIAMINE HCL 100 MG/ML IJ SOLN: 100.0000 mg | Freq: Every day | INTRAMUSCULAR | Status: DC

## 2016-01-11 MED ORDER — FOLIC ACID 1 MG PO TABS
1.0000 mg | ORAL_TABLET | Freq: Every day | ORAL | Status: DC
  Administered 2016-01-12 – 2016-01-13 (×2): 1 mg via ORAL
  Filled 2016-01-11 (×2): qty 1

## 2016-01-11 NOTE — ED Notes (Signed)
Pt only c/o at this time is his feet being cold. Denies pain or anxiety. Pt was dozing on my entry

## 2016-01-11 NOTE — ED Notes (Signed)
Pt is unhooked from his monitor and does not want hooked back up. Has been to the bathroom after being given lasix approx 5x and states he is afraid he wont get to the bathroom on time.

## 2016-01-11 NOTE — ED Provider Notes (Addendum)
[facility] Emergency Department Provider Note        Time seen: ----------------------------------------- 7:29 PM on 01/11/2016 -----------------------------------------    I have reviewed the triage vital signs and the nursing notes.   HISTORY  Chief Complaint Leg Swelling; Neck Pain; and Shortness of Breath    HPI Ryan Howe is a 55 y.o. male who reportedly was just discharged from the hospital yesterday for unknown reasons. Patient states he is not sure why he was admitted, was feeling pretty well when he was discharged. Patient arrived complaining of edema and trouble breathing. Patient was brought back emergently to the room due to fast heartbeat. He was not aware that his heart was racing. He denies fevers or other complaints at this time.   Past Medical History:  Diagnosis Date  . Anxiety   . Hypercholesteremia   . Hypertension     Patient Active Problem List   Diagnosis Date Noted  . Liver failure (HCC) 12/22/2015    Past Surgical History:  Procedure Laterality Date  . NASAL RECONSTRUCTION WITH SEPTAL REPAIR      Allergies Review of patient's allergies indicates no known allergies.  Social History Social History  Substance Use Topics  . Smoking status: Never Smoker  . Smokeless tobacco: Never Used  . Alcohol use Yes    Review of Systems Constitutional: Negative for fever. Cardiovascular: Negative for chest pain. Respiratory: Positive shortness of breath Gastrointestinal: Negative for abdominal pain, vomiting and diarrhea. Genitourinary: Negative for dysuria. Musculoskeletal: Positive for leg swelling Skin: Positive for scalp rash Neurological: Negative for headaches, positive for weakness  10-point ROS otherwise negative.  ____________________________________________   PHYSICAL EXAM:  VITAL SIGNS: ED Triage Vitals [01/11/16 1921]  Enc Vitals Group     BP (!) 119/52     Pulse Rate (!) 180     Resp       Temp      Temp src      SpO2 97 %     Weight      Height      Head Circumference      Peak Flow      Pain Score      Pain Loc      Pain Edu?      Excl. in GC?    Constitutional: Alert and oriented. Mild distress, anxious Eyes: Conjunctivae are normal. PERRL. Normal extraocular movements. ENT   Head: Normocephalic and atraumatic.   Nose: No congestion/rhinnorhea.   Mouth/Throat: Mucous membranes are moist. Small intraoral mucosal contusion   Neck: No stridor. Cardiovascular: Rapid rate, regular rhythm. No murmurs, rubs, or gallops. Respiratory: Normal respiratory effort without tachypnea nor retractions. Breath sounds are clear and equal bilaterally. No wheezes/rales/rhonchi. Gastrointestinal: Soft and nontender. Normal bowel sounds Musculoskeletal: Nontender with normal range of motion in all extremities. Bilateral lower extremity edema Neurologic:  Normal speech and language. No gross focal neurologic deficits are appreciated.  Skin:  Skin is warm, dry and intact. No rash noted. Psychiatric: Anxious mood and affect ____________________________________________  EKG: Interpreted by me. Supraventricular tachycardia with a rate of 206 bpm, normal QRS size, normal QT, normal axis. ST and T-wave changes concerning for subendocardial injury  EKG: Repeat EKG interpreted by me, sinus rhythm with a rate of 97 bpm, normal PR interval, normal QRS, borderline long QT, normal axis. ____________________________________________  ED COURSE:  Pertinent labs & imaging results that were available during my care of the patient were reviewed by me and considered in my medical  decision making (see chart for details). Clinical Course  Patient who presented with significant tachycardia. We'll assess with basic labs and possibly need cardioversion  Patient's heart rate spontaneously returned to a normal sinus rhythm. He did not require  cardioversion.  Procedures ____________________________________________   LABS (pertinent positives/negatives)  Labs Reviewed  CBC - Abnormal; Notable for the following:       Result Value   WBC 11.3 (*)    RBC 2.64 (*)    Hemoglobin 8.6 (*)    HCT 25.7 (*)    RDW 20.7 (*)    Platelets 76 (*)    All other components within normal limits  TROPONIN I - Abnormal; Notable for the following:    Troponin I 0.04 (*)    All other components within normal limits  COMPREHENSIVE METABOLIC PANEL - Abnormal; Notable for the following:    Potassium 3.1 (*)    Glucose, Bld 104 (*)    Calcium 8.0 (*)    Albumin 3.4 (*)    AST 136 (*)    ALT 91 (*)    Alkaline Phosphatase 152 (*)    Total Bilirubin 4.4 (*)    All other components within normal limits  BRAIN NATRIURETIC PEPTIDE - Abnormal; Notable for the following:    B Natriuretic Peptide 439.0 (*)    All other components within normal limits  AMMONIA    RADIOLOGY Images were viewed by me  Chest x-ray IMPRESSION: Perihilar pattern of interstitial edema. No pleural effusions or infiltrates.  ____________________________________________  FINAL ASSESSMENT AND PLAN  dyspnea, Cirrhosis, edema, SVT, slightly elevated troponin level  Plan: Patient with labs and imaging as dictated above. Patient with a 40 pound weight gain since hospital discharge around 2 weeks ago. He is very edematous, would benefit from at least observation and diuresis. He is not currently on diuretics. Ammonia level today is unremarkable, he had transient SVT which may require observation on telemetry. I have started him on IV Lasix, I will discuss with the hospitalist for admission.   Emily Filbert, MD   Note: This dictation was prepared with Dragon dictation. Any transcriptional errors that result from this process are unintentional    Emily Filbert, MD 01/11/16 2113    Emily Filbert, MD 01/11/16 2114

## 2016-01-11 NOTE — H&P (Signed)
[facility] [place] at [facility]   PATIENT NAME: Ryan BirchwoodMichael Howe    MR#:  [id number]  DATE OF BIRTH:  [birth year]  DATE OF ADMISSION:  01/11/2016  PRIMARY CARE PHYSICIAN: [facility]   REQUESTING/REFERRING PHYSICIAN: Mayford KnifeWilliams, M.D.  CHIEF COMPLAINT:   Chief Complaint  Patient presents with  . Leg Swelling  . Neck Pain  . Shortness of Breath    HISTORY OF PRESENT ILLNESS:  Ryan Howe  is a 55 y.o. male who presents with An episode of SVT with correlating symptoms of chest discomfort radiating to his neck and back. Patient states that he has gained 40 pounds over the past couple of weeks and had significant leg swelling and abdominal distention. 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 Tonight in the ED he was initially in SVT with a heart rate in 180s to 200s, he then converted spontaneously to sinus rhythm at a controlled rate. However, his labs are consistent with heart failure. Hospitalists were called for admission and further evaluation  PAST MEDICAL HISTORY:   Past Medical History:  Diagnosis Date  . Anxiety   . Hypercholesteremia   . Hypertension     PAST SURGICAL HISTORY:   Past Surgical History:  Procedure Laterality Date  . NASAL RECONSTRUCTION WITH SEPTAL REPAIR      SOCIAL HISTORY:   Social History  Substance Use Topics  . Smoking status: Former Games developermoker  . Smokeless tobacco: Never Used  . Alcohol use Yes     Comment: drinking daily-4 beers and liquor    FAMILY HISTORY:   Family History  Problem Relation Age of Onset  . Pancreatitis Brother     DRUG ALLERGIES:  No Known Allergies  MEDICATIONS AT HOME:   Prior to Admission medications   Medication Sig Start Date End Date Taking? Authorizing Provider  citalopram (CELEXA) 10 MG tablet Take 1 tablet (10 mg total) by mouth at bedtime. 12/26/15  Yes Alford Highlandichard Wieting, MD  feeding supplement, ENSURE ENLIVE, (ENSURE ENLIVE) LIQD Take  237 mLs by mouth 2 (two) times daily between meals. 12/26/15  Yes Alford Highlandichard Wieting, MD  folic acid (FOLVITE) 1 MG tablet Take 1 tablet (1 mg total) by mouth daily. 12/27/15  Yes Richard Renae GlossWieting, MD  lactulose (CHRONULAC) 10 GM/15ML solution Take 45 mLs (30 g total) by mouth 2 (two) times daily. 12/26/15  Yes Alford Highlandichard Wieting, MD  metoprolol tartrate (LOPRESSOR) 25 MG tablet Take 1 tablet (25 mg total) by mouth 2 (two) times daily. 12/26/15  Yes Alford Highlandichard Wieting, MD  Multiple Vitamin (MULTIVITAMIN WITH MINERALS) TABS tablet Take 1 tablet by mouth daily. 12/27/15  Yes Alford Highlandichard Wieting, MD  prednisoLONE (ORAPRED) 15 MG/5ML solution 40mg  (13.253ml)po daily for 25 days, then 30mg  (10ml) po daily for two days; then 20 (5.593ml)mg daily for two days; then 10 mg (3.243ml)po daily for two days then 5 mg (1.196ml)po daily for two days 12/26/15  Yes Alford Highlandichard Wieting, MD    REVIEW OF SYSTEMS:  Review of Systems  Constitutional: Negative for chills, fever, malaise/fatigue and weight loss.  HENT: Negative for ear pain, hearing loss and tinnitus.   Eyes: Negative for blurred vision, double vision, pain and redness.  Respiratory: Positive for shortness of breath. Negative for cough and hemoptysis.   Cardiovascular: Positive for chest pain, palpitations and leg swelling. Negative for orthopnea.  Gastrointestinal: Negative for abdominal pain, constipation, diarrhea, nausea and vomiting.  Genitourinary: Negative for dysuria, frequency and hematuria.  Musculoskeletal: Negative  for back pain, joint pain and neck pain.  Skin:       No acne, rash, or lesions  Neurological: Negative for dizziness, tremors, focal weakness and weakness.  Endo/Heme/Allergies: Negative for polydipsia. Does not bruise/bleed easily.  Psychiatric/Behavioral: Negative for depression. The patient is not nervous/anxious and does not have insomnia.      VITAL SIGNS:   Vitals:   01/11/16 2000 01/11/16 2030 01/11/16 2100 01/11/16 2130  BP: 136/77 (!) 141/74  137/80 137/77  Pulse: (!) 101 (!) 101 100 96  Resp: (!) 21 20 (!) 24 (!) 21  Temp:      TempSrc:      SpO2: 100% 95% 99% 98%  Weight:      Height:       Wt Readings from Last 3 Encounters:  01/11/16 100.8 kg (222 lb 3 oz)  12/26/15 84.7 kg (186 lb 11.2 oz)  07/01/15 90.7 kg (200 lb)    PHYSICAL EXAMINATION:  Physical Exam  Vitals reviewed. Constitutional: He is oriented to person, place, and time. He appears well-developed and well-nourished. No distress.  HENT:  Head: Normocephalic and atraumatic.  Mouth/Throat: Oropharynx is clear and moist.  Eyes: Conjunctivae and EOM are normal. Pupils are equal, round, and reactive to light. No scleral icterus.  Neck: Normal range of motion. Neck supple. No JVD present. No thyromegaly present.  Cardiovascular: Normal rate, regular rhythm and intact distal pulses.  Exam reveals no gallop and no friction rub.   No murmur heard. Respiratory: Effort normal. No respiratory distress. He has no wheezes. He has rales.  GI: Soft. Bowel sounds are normal. He exhibits distension. There is no tenderness.  Musculoskeletal: Normal range of motion. He exhibits edema.  No arthritis, no gout  Lymphadenopathy:    He has no cervical adenopathy.  Neurological: He is alert and oriented to person, place, and time. No cranial nerve deficit.  No dysarthria, no aphasia  Skin: Skin is warm and dry. No rash noted. No erythema.  Psychiatric: He has a normal mood and affect. His behavior is normal. Judgment and thought content normal.    LABORATORY PANEL:   CBC  Recent Labs Lab 01/11/16 1934  WBC 11.3*  HGB 8.6*  HCT 25.7*  PLT 76*   ------------------------------------------------------------------------------------------------------------------  Chemistries   Recent Labs Lab 01/11/16 1934  NA 136  K 3.1*  CL 103  CO2 23  GLUCOSE 104*  BUN 16  CREATININE 0.96  CALCIUM 8.0*  AST 136*  ALT 91*  ALKPHOS 152*  BILITOT 4.4*    ------------------------------------------------------------------------------------------------------------------  Cardiac Enzymes  Recent Labs Lab 01/11/16 1934  TROPONINI 0.04*   ------------------------------------------------------------------------------------------------------------------  RADIOLOGY:  Dg Chest Port 1 View  Result Date: 01/11/2016 CLINICAL DATA:  Bilateral lower extremity edema. EXAM: PORTABLE CHEST 1 VIEW COMPARISON:  Chest x-ray 07/01/2015 FINDINGS: The cardiac silhouette, mediastinal and hilar contours are within normal limits given the AP projection and portable technique. There is a mild perihilar interstitial process suspicious for early pulmonary edema. No definite pleural effusions. The bony thorax is intact. IMPRESSION: Perihilar pattern of interstitial edema. No pleural effusions or infiltrates. Electronically Signed   By: Rudie Meyer M.D.   On: 01/11/2016 20:33    EKG:   Orders placed or performed during the hospital encounter of 01/11/16  . EKG 12-Lead  . EKG 12-Lead  . EKG 12-Lead  . EKG 12-Lead  . ED EKG  . ED EKG    IMPRESSION AND PLAN:  Principal Problem:   Acute systolic CHF (  congestive heart failure) (HCC) - diuretics given the ED with good initial response, we will reorder dose for the morning. 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   HTN (hypertension) - continue home meds   Anxiety - continue home meds  All the records are reviewed and case discussed with ED provider. Management plans discussed with the patient and/or family.  DVT PROPHYLAXIS:  Mechanical only  GI PROPHYLAXIS: None  ADMISSION STATUS: Inpatient  CODE STATUS: Full Code Status History    Date Active Date Inactive Code Status Order ID Comments User Context   12/22/2015  8:31 PM 12/26/2015  6:53 PM Full Code [id number]  Milagros Loll, MD ED   12/22/2015  4:27 PM 12/22/2015  8:30 PM Full Code [id number]  Willy Eddy, MD ED      TOTAL TIME TAKING CARE OF THIS PATIENT: 45 minutes.    Myrick Mcnairy FIELDING 01/11/2016, 10:00 PM  [employer] [facility]  Office  [phone]  CC: Primary care physician; [facility]

## 2016-01-11 NOTE — ED Triage Notes (Signed)
Pt presents with c/o bilateral lower extremity edema up to knees; short of breath, on exertion as well as at rest; pt says he was resting in bed watching tv when he began to feel worse; pt arrived in SVT with HR 180; pale; EKG performed and pt was taken straight to treatment room; c/o pain to lower extremities, upper back and neck; alert and oriented x 3; talking in complete coherent sentences

## 2016-01-11 NOTE — ED Notes (Signed)
Pt taken back to treatment room 24 after EKG performed; SVT with a rate of 204; Dr Mayford KnifeWilliams straight in to see pt; pt converted to ST heart rate 102 after being assisted to stretcher; no medications given; pt says he feels "a little more calm" now that his heart rate has come down

## 2016-01-12 ENCOUNTER — Inpatient Hospital Stay (HOSPITAL_COMMUNITY)
Admit: 2016-01-12 | Discharge: 2016-01-12 | Disposition: A | Discharge status: Home / Self Care | Payer: Self-pay | Attending: Internal Medicine | Admitting: Internal Medicine

## 2016-01-12 ENCOUNTER — Encounter: Payer: Self-pay | Admitting: Physician Assistant

## 2016-01-12 DIAGNOSIS — D696 Thrombocytopenia, unspecified: Secondary | ICD-10-CM

## 2016-01-12 DIAGNOSIS — I509 Heart failure, unspecified: Secondary | ICD-10-CM

## 2016-01-12 DIAGNOSIS — I4719 Other supraventricular tachycardia: Secondary | ICD-10-CM

## 2016-01-12 DIAGNOSIS — R7989 Other specified abnormal findings of blood chemistry: Secondary | ICD-10-CM

## 2016-01-12 DIAGNOSIS — I471 Supraventricular tachycardia: Secondary | ICD-10-CM

## 2016-01-12 DIAGNOSIS — K7290 Hepatic failure, unspecified without coma: Secondary | ICD-10-CM

## 2016-01-12 DIAGNOSIS — F1010 Alcohol abuse, uncomplicated: Secondary | ICD-10-CM | POA: Diagnosis present

## 2016-01-12 DIAGNOSIS — E876 Hypokalemia: Secondary | ICD-10-CM

## 2016-01-12 DIAGNOSIS — K7460 Unspecified cirrhosis of liver: Secondary | ICD-10-CM

## 2016-01-12 DIAGNOSIS — R778 Other specified abnormalities of plasma proteins: Secondary | ICD-10-CM

## 2016-01-12 DIAGNOSIS — J9601 Acute respiratory failure with hypoxia: Secondary | ICD-10-CM

## 2016-01-12 DIAGNOSIS — D638 Anemia in other chronic diseases classified elsewhere: Secondary | ICD-10-CM | POA: Diagnosis present

## 2016-01-12 LAB — BASIC METABOLIC PANEL
Anion gap: 9 (ref 5–15)
BUN: 14 mg/dL (ref 6–20)
CO2: 23 mmol/L (ref 22–32)
CREATININE: 0.8 mg/dL (ref 0.61–1.24)
Calcium: 7.6 mg/dL — ABNORMAL LOW (ref 8.9–10.3)
Chloride: 104 mmol/L (ref 101–111)
GFR calc Af Amer: >60 mL/min (ref >60)
Glucose, Bld: 97 mg/dL (ref 65–99)
POTASSIUM: 3.1 mmol/L — AB (ref 3.5–5.1)
SODIUM: 136 mmol/L (ref 135–145)

## 2016-01-12 LAB — HEPATIC FUNCTION PANEL
ALT: 81 U/L — ABNORMAL HIGH (ref 17–63)
AST: 129 U/L — ABNORMAL HIGH (ref 15–41)
Albumin: 3.3 g/dL — ABNORMAL LOW (ref 3.5–5.0)
Alkaline Phosphatase: 172 U/L — ABNORMAL HIGH (ref 38–126)
BILIRUBIN INDIRECT: 3.6 mg/dL — AB (ref 0.3–0.9)
Bilirubin, Direct: 3.3 mg/dL — ABNORMAL HIGH (ref 0.1–0.5)
TOTAL PROTEIN: 6.5 g/dL (ref 6.5–8.1)
Total Bilirubin: 6.9 mg/dL — ABNORMAL HIGH (ref 0.3–1.2)

## 2016-01-12 LAB — URINE DRUG SCREEN, QUALITATIVE (ARMC ONLY)
Amphetamines, Ur Screen: NOT DETECTED
BARBITURATES, UR SCREEN: NOT DETECTED
BENZODIAZEPINE, UR SCRN: NOT DETECTED
CANNABINOID 50 NG, UR ~~LOC~~: NOT DETECTED
Cocaine Metabolite,Ur ~~LOC~~: NOT DETECTED
MDMA (Ecstasy)Ur Screen: NOT DETECTED
Methadone Scn, Ur: NOT DETECTED
Opiate, Ur Screen: NOT DETECTED
PHENCYCLIDINE (PCP) UR S: NOT DETECTED
Tricyclic, Ur Screen: NOT DETECTED

## 2016-01-12 LAB — CBC
HEMATOCRIT: 20.7 % — AB (ref 40.0–52.0)
Hemoglobin: 7 g/dL — ABNORMAL LOW (ref 13.0–18.0)
MCH: 32.6 pg (ref 26.0–34.0)
MCHC: 33.7 g/dL (ref 32.0–36.0)
MCV: 96.9 fL (ref 80.0–100.0)
PLATELETS: 60 10*3/uL — AB (ref 150–440)
RBC: 2.14 MIL/uL — ABNORMAL LOW (ref 4.40–5.90)
RDW: 20.2 % — AB (ref 11.5–14.5)
WBC: 9.6 10*3/uL (ref 3.8–10.6)

## 2016-01-12 LAB — HEMOGLOBIN: Hemoglobin: 8.6 g/dL — ABNORMAL LOW (ref 13.0–18.0)

## 2016-01-12 LAB — MAGNESIUM: MAGNESIUM: 1.9 mg/dL (ref 1.7–2.4)

## 2016-01-12 LAB — TROPONIN I
TROPONIN I: 0.09 ng/mL — AB (ref <0.03)
Troponin I: 0.13 ng/mL (ref <0.03)
Troponin I: 0.2 ng/mL (ref <0.03)

## 2016-01-12 LAB — ECHOCARDIOGRAM COMPLETE
HEIGHTINCHES: 72 in
Weight: 3395.2 oz

## 2016-01-12 MED ORDER — PANTOPRAZOLE SODIUM 40 MG PO TBEC
40.0000 mg | DELAYED_RELEASE_TABLET | Freq: Two times a day (BID) | ORAL | Status: DC
  Administered 2016-01-12 – 2016-01-13 (×2): 40 mg via ORAL
  Filled 2016-01-12 (×2): qty 1

## 2016-01-12 MED ORDER — FUROSEMIDE 10 MG/ML IJ SOLN
40.0000 mg | Freq: Two times a day (BID) | INTRAMUSCULAR | Status: DC
  Administered 2016-01-12 – 2016-01-13 (×3): 40 mg via INTRAVENOUS
  Filled 2016-01-12 (×3): qty 4

## 2016-01-12 MED ORDER — MAGNESIUM OXIDE 400 (241.3 MG) MG PO TABS
400.0000 mg | ORAL_TABLET | Freq: Two times a day (BID) | ORAL | Status: DC
  Administered 2016-01-12 – 2016-01-13 (×3): 400 mg via ORAL
  Filled 2016-01-12 (×3): qty 1

## 2016-01-12 MED ORDER — POTASSIUM CHLORIDE CRYS ER 20 MEQ PO TBCR
40.0000 meq | EXTENDED_RELEASE_TABLET | ORAL | Status: AC
  Administered 2016-01-12 (×2): 40 meq via ORAL
  Filled 2016-01-12 (×2): qty 2

## 2016-01-12 MED ORDER — SPIRONOLACTONE 25 MG PO TABS
50.0000 mg | ORAL_TABLET | Freq: Every day | ORAL | Status: DC
  Administered 2016-01-12 – 2016-01-13 (×2): 50 mg via ORAL
  Filled 2016-01-12 (×2): qty 2

## 2016-01-12 NOTE — Progress Notes (Signed)
Patient is alert and oriented x 4, admitted to room 254 with the diagnosis of SVT.  Skin assessment done with Misty StanleyLisa RN, noted 4+ pitting edema on bilateral lower extremities and abrasion (Ketosis per patient) on forehead/head and left knee. Also noted some scattered bruises on arms,  abdomen  and left knee. Tele box called to CCMD by Reita MayYasmin S. RN and Marchelle FolksAmanda NT. Patient received 1 mg of Ativan oral for CIWA withdrawal symptoms. No acute respiratory distress noted. Will continue to monitor.

## 2016-01-12 NOTE — Progress Notes (Signed)
[facility] - [place] at [facility]   PATIENT NAME: Ryan Howe    MR#:  [id number]  DATE OF BIRTH:  [birth year]  SUBJECTIVE:  CHIEF COMPLAINT:   Chief Complaint  Patient presents with  . Leg Swelling  . Neck Pain  . Shortness of Breath   Shortness of breath and leg swelling improving. Has had on and off blood in stool. 1 episode yesterday night.  REVIEW OF SYSTEMS:    Review of Systems  Constitutional: Positive for malaise/fatigue. Negative for chills, fever and weight loss.  HENT: Negative for hearing loss and nosebleeds.   Eyes: Negative for blurred vision, double vision and pain.  Respiratory: Positive for shortness of breath. Negative for cough, hemoptysis, sputum production and wheezing.   Cardiovascular: Positive for leg swelling. Negative for chest pain, palpitations and orthopnea.  Gastrointestinal: Positive for blood in stool. Negative for abdominal pain, constipation, diarrhea, nausea and vomiting.  Genitourinary: Negative for dysuria and hematuria.  Musculoskeletal: Negative for back pain, falls and myalgias.  Skin: Negative for rash.  Neurological: Positive for weakness. Negative for dizziness, tremors, sensory change, speech change, focal weakness, seizures and headaches.  Endo/Heme/Allergies: Does not bruise/bleed easily.  Psychiatric/Behavioral: Negative for depression and memory loss. The patient is not nervous/anxious.     DRUG ALLERGIES:  No Known Allergies  VITALS:  Blood pressure (!) 149/64, pulse 89, temperature 98.6 F (37 C), resp. rate 17, height 6' (1.829 m), weight 96.3 kg (212 lb 3.2 oz), SpO2 93 %.  PHYSICAL EXAMINATION:   Physical Exam  GENERAL:  55 y.o.-year-old patient lying in the bed with no acute distress.  EYES: Pupils equal, round, reactive to light and accommodation. No scleral icterus. Extraocular muscles intact.  HEENT: Head atraumatic, normocephalic. Oropharynx and nasopharynx clear.  NECK:  Supple,  no jugular venous distention. No thyroid enlargement, no tenderness.  LUNGS: Normal breath sounds bilaterally, no wheezing, rales, rhonchi. No use of accessory muscles of respiration.  CARDIOVASCULAR: S1, S2 normal. No murmurs, rubs, or gallops.  ABDOMEN: Soft, nontender, nondistended. Bowel sounds present. No organomegaly or mass.  EXTREMITIES: No cyanosis, clubbing. 3+ LE edema NEUROLOGIC: Cranial nerves II through XII are intact. No focal Motor or sensory deficits b/l.   PSYCHIATRIC: The patient is alert and awake SKIN: No obvious rash, lesion, or ulcer.   LABORATORY PANEL:   CBC  Recent Labs Lab 01/12/16 0525  WBC 9.6  HGB 7.0*  HCT 20.7*  PLT 60*   ------------------------------------------------------------------------------------------------------------------ Chemistries   Recent Labs Lab 01/12/16 0525 01/12/16 1212  NA 136  --   K 3.1*  --   CL 104  --   CO2 23  --   GLUCOSE 97  --   BUN 14  --   CREATININE 0.80  --   CALCIUM 7.6*  --   MG  --  1.9  AST  --  129*  ALT  --  81*  ALKPHOS  --  172*  BILITOT  --  6.9*   ------------------------------------------------------------------------------------------------------------------  Cardiac Enzymes  Recent Labs Lab 01/12/16 1212  TROPONINI 0.09*   ------------------------------------------------------------------------------------------------------------------  RADIOLOGY:  Dg Chest Port 1 View  Result Date: 01/11/2016 CLINICAL DATA:  Bilateral lower extremity edema. EXAM: PORTABLE CHEST 1 VIEW COMPARISON:  Chest x-ray 07/01/2015 FINDINGS: The cardiac silhouette, mediastinal and hilar contours are within normal limits given the AP projection and portable technique. There is a mild perihilar interstitial process suspicious for early pulmonary edema. No definite pleural effusions. The bony thorax is intact.  IMPRESSION: Perihilar pattern of interstitial edema. No pleural effusions or infiltrates.  Electronically Signed   By: Rudie MeyerP.  Gallerani M.D.   On: 01/11/2016 20:33     ASSESSMENT AND PLAN:     Acute On chronic diastolic CHF - IV Lasix, Beta blockers, KCL - Input and Output - Counseled to limit fluids and Salt - Monitor Bun/Cr and Potassium - Echo -Cardiology follow up after discharge  *  SVT (supraventricular tachycardia) Back in normal sinus rhythm  * Worsening anemia with rectal bleeding Consult GI. Start Protonix.   * alcoholic cirrhosis with associated thrombocytopenia    * HTN (hypertension) - continue home meds    * Anxiety - continue home meds  All the records are reviewed and case discussed with Care Management/Social Workerr. Management plans discussed with the patient, family and they are in agreement.  CODE STATUS: FULL CODE  DVT Prophylaxis: SCDs  TOTAL TIME TAKING CARE OF THIS PATIENT: 35 minutes.   POSSIBLE D/C IN 2-3 DAYS, DEPENDING ON CLINICAL CONDITION.  Milagros LollSudini, Bryanah Sidell R M.D on 01/12/2016 at 1:08 PM  Between 7am to 6pm - Pager - [phone]  After 6pm go to [email or web address] - password [email or web address] [facility]  [facility]  Office  [phone]  CC: Primary care physician; [facility]  Note: This dictation was prepared with Dragon dictation along with smaller phrase technology. Any transcriptional errors that result from this process are unintentional.

## 2016-01-12 NOTE — Consult Note (Signed)
Cardiology Consultation Note  Patient ID: Ryan Howe, MRN: [id number], DOB/AGE: 1960-11-04 55 y.o. Admit date: 01/11/2016   Date of Consult: 01/12/2016 Primary Physician: [facility] Primary Cardiologist: New to [facility] Requesting Physician: Dr. Jannifer Franklin, MD  Chief Complaint: Weight gain/ LE swelling/palpitations Reason for Consult: Acute systolic CHF/SVT/elevated troponin  HPI: 55 y.o. 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     He has never seen a cardiologist before. He does report a history of palpitations, though never as bad as what he experienced on 10/8. He has previously drank 4 beers daily, though most recently has been drinking at least 8 beers daily and has been drinking since his teenage years. He previously smoked tobacco x 13 years at 1 pack daily, quitting 20 years ago. He has a remote history of cocaine abuse. He continues to smoke marijuana. He is followed at the [facility] for his liver failure. He was recently admitted to [facility] in late September for pancreatitis and alcoholic cirrhosis with encephalopathy. Weight at timeof discharge was 186 pounds as below. He reported not eating much daily other than a banana and toast at that time. He was spending most of his time drinking alcohol.   Over the 3 days prior to his admission he noted a rapid increase in his weight from 186 pounds to over 200 pounds with associated LE swelling, abdominal distension, increased SOB, and 2-pillow orthopnea. There was also some early satiety. Never with chest pain. On 10/8 he developed sudden  onset of palpitations leading him to present to [facility].   Upon the patient's arrival to [facility] they were found to have BNP of 439, troponin 0.04-->0.20-->0.13, hgb 8.6-->7.0, plt 76-->60, wbc 11.3-->9.6, K+ 3.1-->3.1, SCr 0.96, albumin 3.4, AST 136, ALT 91, alk phos 152, t bili 4.4, ammonia 25. ECG showed AVNRT, 204 bpm, no acute st/t changes, CXR showed perihilar interstitial edema without effusions or infiltrates. He received IV Lasix 40 mg x 2 to date with a UOP of 2.8 L. Weight 222 (reoprted) and 212 via bed scale upon admission which was up from his recent discharge weight of 186 at the end of September. Still with SOB and LE swelling this morning. UDS and echo pending.   Past Medical History:  Diagnosis Date  . Anemia of chronic disease   . Anxiety   . AVNRT (AV nodal re-entry tachycardia) ([place])   . Cirrhosis ([place])   . Hypercholesteremia   . Hypertension   . Liver failure ([place])   . Pancreatitis   . Polysubstance abuse    a. former cocaine and tobacco abuse, ongoing alcohol and marijuana abuse  . Thrombocytopenia ([place])       Most Recent Cardiac Studies: none   Surgical History:  Past Surgical History:  Procedure Laterality Date  . NASAL RECONSTRUCTION WITH SEPTAL REPAIR       Home Meds: Prior to Admission medications   Medication Sig Start Date End Date Taking? Authorizing Provider  citalopram (CELEXA) 10 MG tablet Take 1 tablet (10 mg total) by mouth at bedtime. 12/26/15  Yes Loletha Grayer, MD  feeding supplement, ENSURE ENLIVE, (ENSURE ENLIVE) LIQD Take 237 mLs by mouth  2 (two) times daily between meals. 12/26/15  Yes Loletha Grayer, MD  folic acid (FOLVITE) 1 MG tablet Take 1 tablet (1 mg total) by mouth daily. 12/27/15  Yes Richard Leslye Peer, MD  lactulose (CHRONULAC) 10 GM/15ML solution Take 45 mLs (30 g total) by mouth 2 (two) times daily. 12/26/15  Yes Loletha Grayer, MD  metoprolol tartrate (LOPRESSOR) 25 MG tablet Take 1 tablet (25 mg total) by mouth 2 (two) times daily.  12/26/15  Yes Loletha Grayer, MD  Multiple Vitamin (MULTIVITAMIN WITH MINERALS) TABS tablet Take 1 tablet by mouth daily. 12/27/15  Yes Loletha Grayer, MD  prednisoLONE (ORAPRED) 15 MG/5ML solution 40m (13.353mpo daily for 25 days, then 3052m85m46mo daily for two days; then 20 (5.3ml)59mdaily for two days; then 10 mg (3.3ml)p58maily for two days then 5 mg (1.6ml)po7mily for two days 12/26/15  Yes RichardLoletha Grayer Inpatient Medications:  . citalopram  10 mg Oral QHS  . folic acid  1 mg Oral Daily  . furosemide  40 mg Intravenous BID  . lactulose  30 g Oral BID  . LORazepam  1 mg Intravenous Once  . LORazepam  0-4 mg Oral Q6H   Followed by  . [START ON 01/13/2016] LORazepam  0-4 mg Oral Q12H  . metoprolol tartrate  25 mg Oral BID  . multivitamin with minerals  1 tablet Oral Daily  . potassium chloride  40 mEq Oral Q4H  . spironolactone  50 mg Oral Daily  . thiamine  100 mg Oral Daily   Or  . thiamine  100 mg Intravenous Daily      Allergies: No Known Allergies  Social History   Social History  . Marital status: Single    Spouse name: N/A  . Number of children: N/A  . Years of education: N/A   Occupational History  . Not on file.   Social History Main Topics  . Smoking status: Former Smoker Research scientist (life sciences)keless tobacco: Never Used  . Alcohol use 4.8 oz/week    8 Cans of beer per week     Comment: drinking daily-4 to 8 beers and liquor  . Drug use:     Frequency: 2.0 times per week    Types: Marijuana     Comment: former cocaine user  . Sexual activity: Not on file   Other Topics Concern  . Not on file   Social History Narrative  . No narrative on file     Family History  Problem Relation Age of Onset  . Pancreatitis Brother   . Heart failure Father      Review of Systems: Review of Systems  Constitutional: Positive for malaise/fatigue. Negative for chills, diaphoresis, fever and weight loss.  HENT: Negative for congestion.   Eyes: Negative for discharge  and redness.  Respiratory: Positive for shortness of breath. Negative for cough, hemoptysis, sputum production and wheezing.   Cardiovascular: Positive for palpitations, orthopnea and leg swelling. Negative for chest pain, claudication and PND.  Gastrointestinal: Negative for abdominal pain, blood in stool, heartburn, melena, nausea and vomiting.  Genitourinary: Negative for hematuria.  Musculoskeletal: Negative for falls and myalgias.  Skin: Negative for rash.  Neurological: Positive for dizziness and weakness. Negative for tingling, tremors, sensory change, speech change, focal weakness and loss of consciousness.  Endo/Heme/Allergies: Does not bruise/bleed easily.  Psychiatric/Behavioral: Positive for substance abuse. The patient is nervous/anxious.   All other systems reviewed and are negative.   Labs:  Recent Labs  01/11/16 1934 01/11/16 2338 01/12/16 0525  TROPONINI 0.04* 0.20* 0.13*   Lab Results  Component Value Date   WBC 9.6 01/12/2016   HGB 7.0 (L) 01/12/2016   HCT 20.7 (L) 01/12/2016   MCV 96.9 01/12/2016   PLT 60 (L) 01/12/2016     Recent Labs Lab 01/11/16 1934 01/12/16 0525  NA 136 136  K 3.1* 3.1*  CL 103 104  CO2 23 23  BUN 16 14  CREATININE 0.96 0.80  CALCIUM 8.0* 7.6*  PROT 6.8  --   BILITOT 4.4*  --   ALKPHOS 152*  --   ALT 91*  --   AST 136*  --   GLUCOSE 104* 97   No results found for: CHOL, HDL, LDLCALC, TRIG No results found for: DDIMER  Radiology/Studies:  Ct Head Wo Contrast  Result Date: 12/22/2015 CLINICAL DATA:  Dizziness and nausea.  Gait disturbance. EXAM: CT HEAD WITHOUT CONTRAST TECHNIQUE: Contiguous axial images were obtained from the base of the skull through the vertex without intravenous contrast. COMPARISON:  None. FINDINGS: Brain: There is mild diffuse atrophy. There is no intracranial mass, hemorrhage, extra-axial fluid collection, or midline shift. Gray-white compartments appear normal. No acute infarct evident. Vascular:  There is no appreciable hyperdense vessel. There is slight calcification in the left carotid siphon. There is a small calcification at the origin of the basilar artery. Skull: The bony calvarium appears intact. Sinuses/Orbits: Visualized orbits appear symmetric bilaterally. There is mucosal thickening in multiple ethmoid air cells bilaterally. There is also mild mucosal thickening in the anterior right frontal sinus region. Visualized paranasal sinuses elsewhere clear. Other: Visualized mastoid air cells are clear. IMPRESSION: Mild diffuse atrophy. No intracranial mass, hemorrhage, or extra-axial fluid collection. No focal gray - white compartment lesions are evident. There is rather minimal vascular calcification noted. Areas of paranasal sinus disease noted. Electronically Signed   By: Lowella Grip III M.D.   On: 12/22/2015 17:57   [place] Art/ven Flow Abd Pelv Doppler  Result Date: 12/23/2015 CLINICAL DATA:  55 year old male with splenomegaly EXAM: DUPLEX ULTRASOUND OF LIVER TECHNIQUE: Color and duplex Doppler ultrasound was performed to evaluate the hepatic in-flow and out-flow vessels. COMPARISON:  None. FINDINGS: Portal Vein Velocities Main:  52.5 cm/sec Right:  34.2 cm/sec Left:  24.7 cm/sec Hepatic Vein Velocities Right:  24.7 cm/sec Middle:  20.5 cm/sec Left:  31.0 cm/sec Hepatic Artery Velocity:  208 cm/sec Splenic Vein Velocity:  34.2 cm/sec Varices: None visualized Ascites: None visualized Spleen: 12.1 cm x 5.5 cm x 15.4 cm, with total volume of 541 cm cubic cm. Liver:  Heterogeneous echotexture. IMPRESSION: Heterogeneous appearance of the liver, indicating underlying medical liver disease. Splenomegaly. Hepatopetal flow of the portal venous system. Signed, Dulcy Fanny. Earleen Newport, DO [facility] Radiology Electronically Signed   By: Corrie Mckusick D.O.   On: 12/23/2015 12:05   Dg Chest Port 1 View  Result Date: 01/11/2016 CLINICAL DATA:  Bilateral lower  extremity edema. EXAM: PORTABLE CHEST 1 VIEW COMPARISON:  Chest x-ray 07/01/2015 FINDINGS: The cardiac silhouette, mediastinal and hilar contours are within normal limits given the AP projection and portable technique. There is a mild perihilar interstitial process suspicious for early pulmonary edema. No definite pleural effusions. The bony thorax is intact. IMPRESSION: Perihilar pattern of interstitial edema. No pleural effusions or infiltrates. Electronically Signed   By: Marijo Sanes M.D.   On: 01/11/2016 20:33   US Abdomen Limited Ruq  Result Date: 12/22/2015 CLINICAL DATA:  Jaundice. EXAM:  US ABDOMEN LIMITED - RIGHT UPPER QUADRANT COMPARISON:  None. FINDINGS: Gallbladder: Layering mobile echogenic sludge in the gallbladder. No definite gallstones, wall thickening or pericholecystic fluid. Negative sonographic Murphy sign. Common bile duct: Diameter: 4.0 mm Liver: Extremely course heterogeneous increased echogenicity of the liver. This could be due to fatty infiltration, hepatitis or cirrhosis. No obvious lesions. No intrahepatic biliary dilatation. IMPRESSION: 1. Abnormal ultrasound appearance of the liver as above. 2. Layering echogenic sludge in the gallbladder but no gallstones or evidence of acute cholecystitis. 3. Normal caliber common bile duct. Electronically Signed   By: Marijo Sanes M.D.   On: 12/22/2015 19:16    EKG: Interpreted by me showed: AVNRT, 204 bpm, no acute st/t changes  Telemetry: Interpreted by me showed: NSR, 70's bpm  Weights: Filed Weights   01/11/16 1944 01/11/16 2329 01/12/16 0418  Weight: 222 lb 3 oz (100.8 kg) 211 lb 11.2 oz (96 kg) 212 lb 3.2 oz (96.3 kg)     Physical Exam: Blood pressure (!) 147/67, pulse 96, temperature 98.9 F (37.2 C), temperature source Oral, resp. rate 16, height 6' (1.829 m), weight 212 lb 3.2 oz (96.3 kg), SpO2 93 %. Body mass index is 28.78 kg/m. General: Well developed, well nourished, in no acute distress. Head: Normocephalic,  atraumatic, sclera non-icteric, no xanthomas, nares are without discharge.  Neck: Negative for carotid bruits. JVD elevated. Lungs: Faint bilateral crackles. Breathing is unlabored. Heart: RRR with S1 S2. No murmurs, rubs, or gallops appreciated. Abdomen: Soft, non-tender, distended with normoactive bowel sounds. No hepatomegaly. No rebound/guarding. No obvious abdominal masses. Msk:  Strength and tone appear normal for age. Extremities: No clubbing or cyanosis. 1+ bilateral pitting edema to the knees. Distal pedal pulses are 2+ and equal bilaterally. Mild jaundice.  Neuro: Alert and oriented X 3. No facial asymmetry. No focal deficit. Moves all extremities spontaneously. Psych:  Responds to questions appropriately with a normal affect.    Assessment and Plan:  Active Problems:   Liver failure (HCC)   Alcohol abuse   Anemia of chronic disease   Thrombocytopenia (HCC)   AVNRT (AV nodal re-entry tachycardia) (HCC)   Acute CHF (congestive heart failure) (HCC)   HTN (hypertension)   Hypokalemia   Elevated troponin   Anxiety  1. Acute respiratory distress with hypoxia: -Likely multi factorial including CHF exacerbation, AVNRT, acute on chronic anemia of chronic disease, and volume overload in the setting of liver failure/cirrhosis -Wean oxygen as able  2. Acute CHF exacerbation/LE swelling/SOB: -Likely multifactorial including CHF exacerbation, AVNRT, liver failure, and acute on chronic anemia of chronic disease -EF unknown at this time -Echo pending to evaluate EF, wall motion, and right-sided pressure -Continue IV Lasix 40 mg bid with KCl repletion  -Continue Lopressor, titrate as needed -CHF education  3. AVNRT: -Spontaneously converted in the ED and has remained in sinus rhythm since -Ideally would have patient seen by EP for possible AVNRT ablation. Unfortunately, given his profound anemia with a hgb of 7.0 and thrombocytopenia with a plt count of 60 he is not a candidate for  this procedure at this time -Medical management with beta blocker  -Replete potassium to goal of 4.0  4. Elevated troponin: -Likely supply demand ischemia in the setting of AVNRT and volume overload  -Echo pending -Consider Lexiscan Myoview s/p diuresis  -Given his current thrombocytopenia he would not likely be a candidate for cardiac catheterization  -Hold ASA given plt count -Lopressor as above -Check A1C and lipids for further risk stratification  5. Liver failure/cirrhosis: -Playing a role in his volume overload as well as limiting possible inteventions -Alcohol cessation advised -Likely overall poor prognosis -Needs outpatient specialist -Continue spironolactone and Lasix   6. Acute on chronic anemia of chronic disease: -Transfuse to hgb of 8.5 -Recommend iron -Per IM  7. Thrombocytopenia: -Limiting intervention as above -Per IM  8. Polysubstance abuse: -Check urine drug screen  9. Hypokalemia: -Replete to 4.0 -Check magnesium level and replete to 2.0     Signed, Christell Faith, PA-C [facility] Pager: [phone] 01/12/2016, 9:30 AM

## 2016-01-12 NOTE — Consult Note (Signed)
Ryan Miniumarren Keonta Alsip, [facility] Howe [facility]  [address] [place], [place] [address] Phone: [phone] Fax : [phone]  Consultation  Referring Provider:     No ref. provider found [facility] Primary Gastroenterologist:  None         Reason for Consultation:    Hematochezia  Date of Admission:  01/11/2016 Date of Consultation:  01/12/2016         HPI:   Ryan Howe is a 55 y.o. male Who has a long history of alcohol abuse and presents with alcoholic hepatitis weight gain and leg swelling.  The patient was thought to have congestive heart failure and was seen by cardiology who reported the patient not to have swelling due to a cardiac issue.  The patient did have SPT with a heart rate between 180 and 200.  The patient was seen back in July by Dr. Mechele CollinElliott and at that time no workup was done for this patient due to [facility]  [facility] and a colonoscopy was recommended.  The patient has not had that procedure done yet.  He reports that his rectal bleeding has slowed down since he was admitted to the hospital.  When I came to see the patient today he was eating mashed potatoes and meat.   Past Medical History:  Diagnosis Date  . Anemia of chronic disease   . Anxiety   . AVNRT (AV nodal re-entry tachycardia) (HCC)   . Cirrhosis (HCC)   . Hypercholesteremia   . Hypertension   . Liver failure (HCC)   . Pancreatitis   . Polysubstance abuse    a. former cocaine and tobacco abuse, ongoing alcohol and marijuana abuse  . Thrombocytopenia (HCC)     Past Surgical History:  Procedure Laterality Date  . NASAL RECONSTRUCTION WITH SEPTAL REPAIR      Prior to Admission medications   Medication Sig Start Date End Date Taking? Authorizing Provider  citalopram (CELEXA) 10 MG tablet Take 1 tablet (10 mg total) by mouth at bedtime. 12/26/15  Yes Alford Highlandichard Wieting, Ryan Howe  feeding supplement, ENSURE  ENLIVE, (ENSURE ENLIVE) LIQD Take 237 mLs by mouth 2 (two) times daily between meals. 12/26/15  Yes Alford Highlandichard Wieting, Ryan Howe  folic acid (FOLVITE) 1 MG tablet Take 1 tablet (1 mg total) by mouth daily. 12/27/15  Yes Richard Renae GlossWieting, Ryan Howe  lactulose (CHRONULAC) 10 GM/15ML solution Take 45 mLs (30 g total) by mouth 2 (two) times daily. 12/26/15  Yes Alford Highlandichard Wieting, Ryan Howe  metoprolol tartrate (LOPRESSOR) 25 MG tablet Take 1 tablet (25 mg total) by mouth 2 (two) times daily. 12/26/15  Yes Alford Highlandichard Wieting, Ryan Howe  Multiple Vitamin (MULTIVITAMIN WITH MINERALS) TABS tablet Take 1 tablet by mouth daily. 12/27/15  Yes Alford Highlandichard Wieting, Ryan Howe  prednisoLONE (ORAPRED) 15 MG/5ML solution 40mg  (13.713ml)po daily for 25 days, then 30mg  (10ml) po daily for two days; then 20 (5.63ml)mg daily for two days; then 10 mg (3.643ml)po daily for two days then 5 mg (1.816ml)po daily for two days 12/26/15  Yes Alford Highlandichard Wieting, Ryan Howe    Family History  Problem Relation Age of Onset  . Pancreatitis Brother   . Heart failure Father      Social History  Substance Use Topics  . Smoking status: Former Games developermoker  . Smokeless tobacco: Never Used  . Alcohol use 4.8 oz/week    8 Cans of beer per week     Comment: drinking  daily-4 to 8 beers and liquor    Allergies as of 01/11/2016  . (No Known Allergies)    Review of Systems:    All systems reviewed and negative except where noted in HPI.   Physical Exam:  Vital signs in last 24 hours: Temp:  [98.6 F (37 C)-98.9 F (37.2 C)] 98.8 F (37.1 C) (10/09 2000) Pulse Rate:  [89-108] 91 (10/09 2000) Resp:  [15-21] 15 (10/09 2000) BP: (136-151)/(64-77) 136/68 (10/09 2000) SpO2:  [93 %-99 %] 99 % (10/09 2000) Weight:  [211 lb 11.2 oz (96 kg)-212 lb 3.2 oz (96.3 kg)] 212 lb 3.2 oz (96.3 kg) (10/09 0418) Last BM Date: 01/12/16 General:   Pleasant, cooperative in NAD Head:  Normocephalic and atraumatic. Eyes:   No icterus.   Conjunctiva pink. PERRLA. Ears:  Normal auditory acuity. Neck:  Supple; no  masses or thyroidomegaly Lungs: Respirations even and unlabored. Lungs clear to auscultation bilaterally.   No wheezes, crackles, or rhonchi.  Heart:  Regular rate and rhythm;  Without murmur, clicks, rubs or gallops Abdomen:  Soft, nondistended, nontender. Normal bowel sounds. No appreciable masses or hepatomegaly.  No rebound or guarding.  Rectal:  Not performed. Msk:  Symmetrical without gross deformities.    Extremities:  Positive edema, cyanosis or clubbing. Neurologic:  Alert and oriented x3;  Resting tremor Skin:  Intact without significant lesions or rashes. Cervical Nodes:  No significant cervical adenopathy. Psych:  Alert and cooperative. Normal affect.  LAB RESULTS:  Recent Labs  01/11/16 1934 01/12/16 0525 01/12/16 1757  WBC 11.3* 9.6  --   HGB 8.6* 7.0* 8.6*  HCT 25.7* 20.7*  --   PLT 76* 60*  --    BMET  Recent Labs  01/11/16 1934 01/12/16 0525  NA 136 136  K 3.1* 3.1*  CL 103 104  CO2 23 23  GLUCOSE 104* 97  BUN 16 14  CREATININE 0.96 0.80  CALCIUM 8.0* 7.6*   LFT  Recent Labs  01/12/16 1212  PROT 6.5  ALBUMIN 3.3*  AST 129*  ALT 81*  ALKPHOS 172*  BILITOT 6.9*  BILIDIR 3.3*  IBILI 3.6*   PT/INR No results for input(s): LABPROT, INR in the last 72 hours.  STUDIES: Dg Chest Port 1 View  Result Date: 01/11/2016 CLINICAL DATA:  Bilateral lower extremity edema. EXAM: PORTABLE CHEST 1 VIEW COMPARISON:  Chest x-ray 07/01/2015 FINDINGS: The cardiac silhouette, mediastinal and hilar contours are within normal limits given the AP projection and portable technique. There is a mild perihilar interstitial process suspicious for early pulmonary edema. No definite pleural effusions. The bony thorax is intact. IMPRESSION: Perihilar pattern of interstitial edema. No pleural effusions or infiltrates. Electronically Signed   By: Rudie Meyer M.D.   On: 01/11/2016 20:33      Impression / Plan:   Ryan Howe is a 55 y.o. y/o male with rectal bleeding  and labs consistent with  Acute alcoholic hepatitis. The patient cannot be set up for a colonoscopy at this time because he has eat a regular diet including dinner tonight. The patient is apprehensive about undergoing any endoscopic procedures because of mounting  Healthcare bills.  The patient  States that his bleeding has slowed down.  His hemoglobin will be followed and if it continues to drop then the patient will be set up for a colonoscopy.  The patient has been explained the plan and agrees with it.  Thank you for involving me in the care of this patient.  LOS: 1 day   Ryan Minium, Ryan Howe  01/12/2016, 9:14 PM   Note: This dictation was prepared with Dragon dictation along with smaller phrase technology. Any transcriptional errors that result from this process are unintentional.

## 2016-01-12 NOTE — Progress Notes (Signed)
Patient alert and oriented, moderate plus on safety.  Ambulated in hallway with nursing today.  Walked around nursing station several times, no dizziness or shortness of breath.  Scoring 5-6 on ciwa with noted tremors and some anxiety.  VSS, NSR on monitor, RA.  Continue to monitor.

## 2016-01-12 NOTE — Progress Notes (Signed)
Troponin =0.20, and trended down to 0.13, Dr. Sheryle Hailiamond notified but no new order received. Patient is chest pain free. Will continue to monitor.

## 2016-01-12 NOTE — Care Management (Signed)
Patient presents from home.  He lives with his mother.  Is not currently working for the past month and a half due to leg pain and "this place on my head that makes me look like a beast>"  He presented with abdominal swelling and found to be in SVT at a rate up to 200.  Converted to NSR.  Admitted with congestive heart failure.  He is not requiring any 02.  Is ambulatory.  [facility] and [facility]. He may benefit from heart failure clinic referral.  He has never applied for medicaid.  Says he can't do so because "I am too young."  Today I feel like I am disabled.  Discussed initiating an application

## 2016-01-12 NOTE — Progress Notes (Signed)
  Echocardiogram 2D Echocardiogram has been performed.  Ryan SavoyCasey N Hopie Howe 01/12/2016, 11:06 AM

## 2016-01-13 ENCOUNTER — Telehealth: Payer: Self-pay | Admitting: Cardiovascular Disease

## 2016-01-13 DIAGNOSIS — K7210 Chronic hepatic failure without coma: Secondary | ICD-10-CM

## 2016-01-13 DIAGNOSIS — R0600 Dyspnea, unspecified: Secondary | ICD-10-CM

## 2016-01-13 DIAGNOSIS — R601 Generalized edema: Secondary | ICD-10-CM

## 2016-01-13 DIAGNOSIS — D696 Thrombocytopenia, unspecified: Secondary | ICD-10-CM

## 2016-01-13 DIAGNOSIS — E876 Hypokalemia: Secondary | ICD-10-CM

## 2016-01-13 DIAGNOSIS — F419 Anxiety disorder, unspecified: Secondary | ICD-10-CM

## 2016-01-13 DIAGNOSIS — I10 Essential (primary) hypertension: Secondary | ICD-10-CM

## 2016-01-13 DIAGNOSIS — I5031 Acute diastolic (congestive) heart failure: Secondary | ICD-10-CM

## 2016-01-13 LAB — CBC WITH DIFFERENTIAL/PLATELET
BASOS PCT: 0 %
Basophils Absolute: 0 10*3/uL (ref 0–0.1)
EOS ABS: 0.2 10*3/uL (ref 0–0.7)
EOS PCT: 2 %
HCT: 23.4 % — ABNORMAL LOW (ref 40.0–52.0)
HEMOGLOBIN: 7.9 g/dL — AB (ref 13.0–18.0)
LYMPHS ABS: 2.1 10*3/uL (ref 1.0–3.6)
Lymphocytes Relative: 19 %
MCH: 33.5 pg (ref 26.0–34.0)
MCHC: 33.9 g/dL (ref 32.0–36.0)
MCV: 98.7 fL (ref 80.0–100.0)
MONOS PCT: 6 %
Monocytes Absolute: 0.6 10*3/uL (ref 0.2–1.0)
NEUTROS PCT: 73 %
Neutro Abs: 8 10*3/uL — ABNORMAL HIGH (ref 1.4–6.5)
PLATELETS: 59 10*3/uL — AB (ref 150–440)
RBC: 2.37 MIL/uL — AB (ref 4.40–5.90)
RDW: 20.2 % — ABNORMAL HIGH (ref 11.5–14.5)
WBC: 11 10*3/uL — AB (ref 3.8–10.6)

## 2016-01-13 LAB — COMPREHENSIVE METABOLIC PANEL
ALBUMIN: 3.2 g/dL — AB (ref 3.5–5.0)
ALK PHOS: 167 U/L — AB (ref 38–126)
ALT: 70 U/L — ABNORMAL HIGH (ref 17–63)
ANION GAP: 8 (ref 5–15)
AST: 101 U/L — ABNORMAL HIGH (ref 15–41)
BUN: 20 mg/dL (ref 6–20)
CHLORIDE: 103 mmol/L (ref 101–111)
CO2: 23 mmol/L (ref 22–32)
Calcium: 8.3 mg/dL — ABNORMAL LOW (ref 8.9–10.3)
Creatinine, Ser: 0.98 mg/dL (ref 0.61–1.24)
GFR calc non Af Amer: >60 mL/min (ref >60)
GLUCOSE: 159 mg/dL — AB (ref 65–99)
Potassium: 2.9 mmol/L — ABNORMAL LOW (ref 3.5–5.1)
SODIUM: 134 mmol/L — AB (ref 135–145)
Total Bilirubin: 6.9 mg/dL — ABNORMAL HIGH (ref 0.3–1.2)
Total Protein: 6.4 g/dL — ABNORMAL LOW (ref 6.5–8.1)

## 2016-01-13 LAB — POTASSIUM: Potassium: 4.1 mmol/L (ref 3.5–5.1)

## 2016-01-13 MED ORDER — PANTOPRAZOLE SODIUM 40 MG PO TBEC: 40.0000 mg | DELAYED_RELEASE_TABLET | Freq: Every day | ORAL | 0 refills | Status: DC

## 2016-01-13 MED ORDER — POTASSIUM CHLORIDE CRYS ER 20 MEQ PO TBCR
40.0000 meq | EXTENDED_RELEASE_TABLET | ORAL | Status: AC
  Administered 2016-01-13 (×2): 40 meq via ORAL
  Filled 2016-01-13 (×2): qty 2

## 2016-01-13 MED ORDER — FUROSEMIDE 40 MG PO TABS: 40.0000 mg | ORAL_TABLET | Freq: Every day | ORAL | 0 refills | Status: DC

## 2016-01-13 MED ORDER — POTASSIUM CHLORIDE ER 20 MEQ PO TBCR: 20.0000 meq | EXTENDED_RELEASE_TABLET | Freq: Every day | ORAL | 0 refills | Status: DC

## 2016-01-13 MED ORDER — FUROSEMIDE 40 MG PO TABS: 40.0000 mg | ORAL_TABLET | Freq: Two times a day (BID) | ORAL | Status: DC

## 2016-01-13 MED ORDER — SPIRONOLACTONE 50 MG PO TABS: 50.0000 mg | ORAL_TABLET | Freq: Every day | ORAL | 0 refills | Status: DC

## 2016-01-13 MED ORDER — FERROUS SULFATE 325 (65 FE) MG PO TABS: 325.0000 mg | ORAL_TABLET | Freq: Two times a day (BID) | ORAL | 0 refills | Status: DC

## 2016-01-13 MED ORDER — POTASSIUM CHLORIDE 10 MEQ/100ML IV SOLN
10.0000 meq | INTRAVENOUS | Status: DC
Start: 2016-01-13 — End: 2016-01-13
  Administered 2016-01-13: 10 meq via INTRAVENOUS
  Filled 2016-01-13 (×2): qty 100

## 2016-01-13 NOTE — Progress Notes (Signed)
Potassium back to normal range. Dr. Elpidio AnisSudini aware. Patient given discharge teaching and paperwork regarding medications, diet, follow-up appointments and activity. Patient understanding verbalized. No complaints at this time. IV and telemetry discontinued prior to leaving. Skin assessment as previously charted and vitals are stable; on room air. Patient being discharged to home. Family present during discharge teaching. No further needs by Care Management. Prescriptions given to patient.

## 2016-01-13 NOTE — Telephone Encounter (Signed)
TCM...the patient is being discharged today He was seen in hospital by Dr Kirke CorinArida Pt is coming on 01/26/16 to see Alycia Rossettiyan

## 2016-01-13 NOTE — Care Management (Signed)
Patient's scripts have been faxed to [facility].  Spoke with Mayo SinkRita and she confirms that patient has completed his eligibility .

## 2016-01-13 NOTE — Progress Notes (Signed)
Patient: Ryan Howe / Admit Date: 01/11/2016 / Date of Encounter: 01/13/2016, 8:45 AM   Subjective: Breathing better this morning. LE swelling improved. Echo showed normal EF, no RWMA, mild MR, PASP could not be estimated. HGB stable, though remains low at 7.9 this morning.   Review of Systems: Review of Systems  Constitutional: Positive for malaise/fatigue. Negative for chills, diaphoresis, fever and weight loss.  HENT: Negative for congestion.   Eyes: Negative for discharge and redness.  Respiratory: Positive for shortness of breath. Negative for cough, hemoptysis, sputum production and wheezing.   Cardiovascular: Positive for leg swelling. Negative for chest pain, palpitations, orthopnea, claudication and PND.  Gastrointestinal: Negative for abdominal pain, heartburn, nausea and vomiting.  Genitourinary: Negative for hematuria.  Musculoskeletal: Negative for falls and myalgias.  Skin: Negative for rash.  Neurological: Positive for weakness. Negative for dizziness, tingling, tremors, sensory change, speech change, focal weakness and loss of consciousness.  Endo/Heme/Allergies: Does not bruise/bleed easily.  Psychiatric/Behavioral: Negative for substance abuse. The patient is not nervous/anxious.   All other systems reviewed and are negative.   Objective: Telemetry: NSR, 80's bpm Physical Exam: Blood pressure 135/79, pulse 75, temperature 98 F (36.7 C), resp. rate 18, height 6' (1.829 m), weight 197 lb (89.4 kg), SpO2 98 %. Body mass index is 26.72 kg/m. General: Well developed, well nourished, in no acute distress. Head: Normocephalic, atraumatic, sclera non-icteric, no xanthomas, nares are without discharge. Neck: Negative for carotid bruits. JVP not elevated. Lungs: Clear bilaterally to auscultation without wheezes, rales, or rhonchi. Breathing is unlabored. Heart: RRR S1 S2 without murmurs, rubs, or gallops.  Abdomen: Soft, non-tender, non-distended with normoactive  bowel sounds. No rebound/guarding. Extremities: No clubbing or cyanosis. No edema. Distal pedal pulses are 2+ and equal bilaterally. Mild jaundice.  Neuro: Alert and oriented X 3. Moves all extremities spontaneously. Psych:  Responds to questions appropriately with a normal affect.   Intake/Output Summary (Last 24 hours) at 01/13/16 0845 Last data filed at 01/12/16 1855  Gross per 24 hour  Intake              720 ml  Output             1300 ml  Net             -580 ml    Inpatient Medications:  . citalopram  10 mg Oral QHS  . folic acid  1 mg Oral Daily  . furosemide  40 mg Intravenous BID  . lactulose  30 g Oral BID  . LORazepam  1 mg Intravenous Once  . LORazepam  0-4 mg Oral Q6H   Followed by  . LORazepam  0-4 mg Oral Q12H  . magnesium oxide  400 mg Oral BID  . metoprolol tartrate  25 mg Oral BID  . multivitamin with minerals  1 tablet Oral Daily  . pantoprazole  40 mg Oral BID AC  . spironolactone  50 mg Oral Daily  . thiamine  100 mg Oral Daily   Or  . thiamine  100 mg Intravenous Daily   Infusions:    Labs:  Recent Labs  01/11/16 1934 01/12/16 0525 01/12/16 1212  NA 136 136  --   K 3.1* 3.1*  --   CL 103 104  --   CO2 23 23  --   GLUCOSE 104* 97  --   BUN 16 14  --   CREATININE 0.96 0.80  --   CALCIUM 8.0* 7.6*  --  MG  --   --  1.9    Recent Labs  01/11/16 1934 01/12/16 1212  AST 136* 129*  ALT 91* 81*  ALKPHOS 152* 172*  BILITOT 4.4* 6.9*  PROT 6.8 6.5  ALBUMIN 3.4* 3.3*    Recent Labs  01/12/16 0525 01/12/16 1757 01/13/16 0445  WBC 9.6  --  11.0*  NEUTROABS  --   --  8.0*  HGB 7.0* 8.6* 7.9*  HCT 20.7*  --  23.4*  MCV 96.9  --  98.7  PLT 60*  --  59*    Recent Labs  01/11/16 1934 01/11/16 2338 01/12/16 0525 01/12/16 1212  TROPONINI 0.04* 0.20* 0.13* 0.09*   Invalid input(s): POCBNP No results for input(s): HGBA1C in the last 72 hours.   Weights: Filed Weights   01/11/16 2329 01/12/16 0418 01/13/16 0514  Weight: 211  lb 11.2 oz (96 kg) 212 lb 3.2 oz (96.3 kg) 197 lb (89.4 kg)     Radiology/Studies:  Ct Head Wo Contrast  Result Date: 12/22/2015 CLINICAL DATA:  Dizziness and nausea.  Gait disturbance. EXAM: CT HEAD WITHOUT CONTRAST TECHNIQUE: Contiguous axial images were obtained from the base of the skull through the vertex without intravenous contrast. COMPARISON:  None. FINDINGS: Brain: There is mild diffuse atrophy. There is no intracranial mass, hemorrhage, extra-axial fluid collection, or midline shift. Gray-white compartments appear normal. No acute infarct evident. Vascular: There is no appreciable hyperdense vessel. There is slight calcification in the left carotid siphon. There is a small calcification at the origin of the basilar artery. Skull: The bony calvarium appears intact. Sinuses/Orbits: Visualized orbits appear symmetric bilaterally. There is mucosal thickening in multiple ethmoid air cells bilaterally. There is also mild mucosal thickening in the anterior right frontal sinus region. Visualized paranasal sinuses elsewhere clear. Other: Visualized mastoid air cells are clear. IMPRESSION: Mild diffuse atrophy. No intracranial mass, hemorrhage, or extra-axial fluid collection. No focal gray - white compartment lesions are evident. There is rather minimal vascular calcification noted. Areas of paranasal sinus disease noted. Electronically Signed   By: Bretta Bang III M.D.   On: 12/22/2015 17:57   [place] Art/ven Flow Abd Pelv Doppler  Result Date: 12/23/2015 CLINICAL DATA:  55 year old male with splenomegaly EXAM: DUPLEX ULTRASOUND OF LIVER TECHNIQUE: Color and duplex Doppler ultrasound was performed to evaluate the hepatic in-flow and out-flow vessels. COMPARISON:  None. FINDINGS: Portal Vein Velocities Main:  52.5 cm/sec Right:  34.2 cm/sec Left:  24.7 cm/sec Hepatic Vein Velocities Right:  24.7 cm/sec Middle:  20.5 cm/sec Left:  31.0 cm/sec Hepatic Artery Velocity:  208 cm/sec Splenic Vein Velocity:   34.2 cm/sec Varices: None visualized Ascites: None visualized Spleen: 12.1 cm x 5.5 cm x 15.4 cm, with total volume of 541 cm cubic cm. Liver:  Heterogeneous echotexture. IMPRESSION: Heterogeneous appearance of the liver, indicating underlying medical liver disease. Splenomegaly. Hepatopetal flow of the portal venous system. Signed, Yvone Neu. Loreta Ave, DO [facility] Electronically Signed   By: Gilmer Mor D.O.   On: 12/23/2015 12:05   Dg Chest Port 1 View  Result Date: 01/11/2016 CLINICAL DATA:  Bilateral lower extremity edema. EXAM: PORTABLE CHEST 1 VIEW COMPARISON:  Chest x-ray 07/01/2015 FINDINGS: The cardiac silhouette, mediastinal and hilar contours are within normal limits given the AP projection and portable technique. There is a mild perihilar interstitial process suspicious for early pulmonary edema. No definite pleural effusions. The bony thorax is intact. IMPRESSION: Perihilar pattern of interstitial edema. No pleural effusions or  infiltrates. Electronically Signed   By: Rudie MeyerP.  Gallerani M.D.   On: 01/11/2016 20:33   [place] Abdomen Limited Ruq  Result Date: 12/22/2015 CLINICAL DATA:  Jaundice. EXAM: US ABDOMEN LIMITED - RIGHT UPPER QUADRANT COMPARISON:  None. FINDINGS: Gallbladder: Layering mobile echogenic sludge in the gallbladder. No definite gallstones, wall thickening or pericholecystic fluid. Negative sonographic Murphy sign. Common bile duct: Diameter: 4.0 mm Liver: Extremely course heterogeneous increased echogenicity of the liver. This could be due to fatty infiltration, hepatitis or cirrhosis. No obvious lesions. No intrahepatic biliary dilatation. IMPRESSION: 1. Abnormal ultrasound appearance of the liver as above. 2. Layering echogenic sludge in the gallbladder but no gallstones or evidence of acute cholecystitis. 3. Normal caliber common bile duct. Electronically Signed   By: Rudie MeyerP.  Gallerani M.D.   On: 12/22/2015 19:16      Assessment and Plan  Active Problems:   Liver failure (HCC)   Alcohol abuse   Anemia of chronic disease   Thrombocytopenia (HCC)   AVNRT (AV nodal re-entry tachycardia) (HCC)   Acute CHF (congestive heart failure) (HCC)   HTN (hypertension)   Hypokalemia   Elevated troponin   Anxiety   Cirrhosis of liver with ascites (HCC)    1. Acute respiratory distress with hypoxia: -Improved, off oxygen now -Likely multi factorial including CHF exacerbation, AVNRT, acute on chronic anemia of chronic disease, and volume overload in the setting of liver failure/cirrhosis  2. Acute on chronic diastolic CHF exacerbation/LE swelling/SOB: -Likely multifactorial including diastolic CHF exacerbation, AVNRT, liver failure, and acute on chronic anemia of chronic disease -Change IV Lasix to PO Lasix 40 mg bid with KCl repletion -Continue Lopressor, titrate as needed -CHF education  3. AVNRT: -Spontaneously converted in the ED and has remained in sinus rhythm since -Ideally would have patient seen by EP for possible AVNRT ablation. Unfortunately, given his profound anemia with a hgb of 7.0 and thrombocytopenia with a plt count of 60 he is not a candidate for this procedure at this time -Medical management with beta blocker  -Replete potassium to goal of 4.0  4. Elevated troponin: -Likely supply demand ischemia in the setting of AVNRT and volume overload  -Echo with normal EF -Consider Lexiscan Myoview s/p diuresis  -Given his current thrombocytopenia he would not likely be a candidate for cardiac catheterization  -Hold ASA given plt count -Lopressor as above -A!c and lipid as above   5. Liver failure/cirrhosis: -Playing a role in his volume overload as well as limiting possible inteventions -Alcohol cessation advised -Likely overall poor prognosis -Needs outpatient specialist -Continue spironolactone and Lasix  -No cmet today  6. Acute on chronic anemia of chronic disease: -Transfuse to  hgb of 8.5 -Recommend iron -Per IM  7. Thrombocytopenia: -Limiting intervention as above -Per IM  8. Polysubstance abuse: -UDS negative  9. Hypokalemia: -Replete to 4.0 -Check magnesium level and replete to 2.0  Signed, Eula ListenRyan Dunn, PA-C [facility] Pager: [phone] 01/13/2016, 8:45 AM     Attending Note Patient seen and examined, agree with detailed note above,  Patient presentation and plan discussed on rounds.   Patient reports breathing is much better this morning Leg swelling gone  Long discussion with patient and his mother sitting at his bedside concerning his alcohol abuse  he reports 2 weeks he had severe tremors He has been without alcohol this admission, has not had withdrawal  Telemetry reviewed showing no significant SVT Lab work reviewed showing severely depressed potassium 2.9 Supplementation ordered Long discussion concerning his anemia, low  platelets, elevated LFTs All likely secondary to underlying liver disease, poor diet  Clinical exam, lungs are clear to auscultation bilaterally, heart sounds regular with no murmur appreciated, JVP to 12 cm, abdomen soft nontender, no significant leg swelling/edema  ----Acute on chronic diastolic CHF Likely multifactorial secondary to high fluid intake/beers Secondary to severe anemia   as well as underlying liver dysfunction Will require Lasix daily as an outpatient with potassium Follow-up in the [facility] Alcohol cessation recommended  ----Anemia Recommended he start iron supplementation, modify his diet, decrease alcohol Unable to exclude bone marrow suppression from heavy alcohol  ----Thrombocytopenia Secondary to underlying liver disease  ----Liver failure/cirrhosis Secondary to long-standing alcohol abuse Recommended alcohol cessation Would likely benefit from rehabilitation again  Long discussion with patient and patient's mother, all questions answered  Greater than 50% was  spent in counseling and coordination of care with patient Total encounter time 35 minutes or more   Signed: Dossie Arbour  M.D., Ph.D. [facility]

## 2016-01-13 NOTE — Progress Notes (Signed)
Dr. Elpidio AnisSudini notified of low potassium results. MD to place orders for supplementation prior to discharging this afternoon. Will continue to monitor.

## 2016-01-13 NOTE — Telephone Encounter (Signed)
Pt still currently admitted.

## 2016-01-13 NOTE — Telephone Encounter (Signed)
[facility].  Patient understands to follow up with provider Eula Listenyan Dunn on 10/23 at 1:30pm [facility] [place]. Patient understands discharge instructions? yes Patient understands medications and regiment? yes Patient understands to bring all medications to this visit? yes  He will further review d/c paperwork and call w/any questions.

## 2016-01-15 NOTE — Discharge Summary (Signed)
[facility] [place] [facility]   PATIENT NAME: Ryan Howe    MR#:  [id number]  DATE OF BIRTH:  07/12/1960  DATE OF ADMISSION:  01/11/2016 ADMITTING PHYSICIAN: Oralia Manis, MD  DATE OF DISCHARGE: 01/13/2016  3:30 PM  PRIMARY CARE PHYSICIAN: [facility]   ADMISSION DIAGNOSIS:  SVT (supraventricular tachycardia) (HCC) [I47.1] Generalized edema [R60.1] Cirrhosis of liver with ascites, unspecified hepatic cirrhosis type (HCC) [K74.60] Dyspnea, unspecified type [R06.00]  DISCHARGE DIAGNOSIS:  Active Problems:   Liver failure (HCC)   HTN (hypertension)   Anxiety   Alcohol abuse   Anemia of chronic disease   Hypokalemia   Thrombocytopenia (HCC)   Elevated troponin   AVNRT (AV nodal re-entry tachycardia) (HCC)   Acute CHF (congestive heart failure) (HCC)   Cirrhosis of liver with ascites (HCC)   Dyspnea   Generalized edema   SECONDARY DIAGNOSIS:   Past Medical History:  Diagnosis Date  . Anemia of chronic disease   . Anxiety   . AVNRT (AV nodal re-entry tachycardia) (HCC)   . Cirrhosis (HCC)   . Hypercholesteremia   . Hypertension   . Liver failure (HCC)   . Pancreatitis   . Polysubstance abuse    a. former cocaine and tobacco abuse, ongoing alcohol and marijuana abuse  . Thrombocytopenia [facility])      ADMITTING HISTORY  Ryan Howe  is a 55 y.o. male who presents with An episode of SVT with correlating symptoms of chest discomfort radiating to his neck and back. Patient states that he has gained 40 pounds over the past couple of weeks and had significant leg swelling and abdominal distention. 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 Tonight in the ED he was initially in SVT with a heart rate in 180s to 200s, he then converted spontaneously to sinus rhythm at a controlled rate. However, his labs are consistent with heart failure. Hospitalists were called for admission and further  evaluation  HOSPITAL COURSE:   Acute On chronic diastolic CHF - improved Treated with  - IV Lasix, Beta blockers, KCL - Input and Output monitored - Counseled to limit fluids and Salt - Monitored Bun/Cr and Potassium - Echo - Normal EF -Cardiology follow up after discharge - Followed by cardiology in the hospital.  Discharge home on oral Lasix with potassium pills.  *SVT (supraventricular tachycardia) Back in normal sinus rhythm  * Worsening anemia with rectal bleeding Started on Protonix. Consulted GI. Dr. Aggie Cosier saw the patient and advised outpatient EGD and colonoscopy with follow-up. Hemoglobin stable at this point.  * Alcoholic cirrhosis with associated thrombocytopenia Counseled to quit alcohol.  * HTN (hypertension) - continue home meds  * Anxiety - continue home meds  Stable for discharge home to follow with cardiology and GI.  CONSULTS OBTAINED:  Treatment Team:  Iran Ouch, MD Midge Minium, MD  DRUG ALLERGIES:  No Known Allergies  DISCHARGE MEDICATIONS:   Discharge Medication List as of 01/13/2016  2:54 PM    START taking these medications   Details  ferrous sulfate 325 (65 FE) MG tablet Take 1 tablet (325 mg total) by mouth 2 (two) times daily with a meal., Starting Tue 01/13/2016, Print    furosemide (LASIX) 40 MG tablet Take 1 tablet (40 mg total) by mouth daily., Starting Tue 01/13/2016, Print    pantoprazole (PROTONIX) 40 MG tablet Take 1 tablet (40 mg total) by mouth daily., Starting Tue 01/13/2016, Print  potassium chloride 20 MEQ TBCR Take 20 mEq by mouth daily., Starting Tue 01/13/2016, Print    spironolactone (ALDACTONE) 50 MG tablet Take 1 tablet (50 mg total) by mouth daily., Starting Tue 01/13/2016, Print      CONTINUE these medications which have NOT CHANGED   Details  citalopram (CELEXA) 10 MG tablet Take 1 tablet (10 mg total) by mouth at bedtime., Starting Fri 12/26/2015, Print    feeding supplement, ENSURE  ENLIVE, (ENSURE ENLIVE) LIQD Take 237 mLs by mouth 2 (two) times daily between meals., Starting Fri 12/26/2015, Print    folic acid (FOLVITE) 1 MG tablet Take 1 tablet (1 mg total) by mouth daily., Starting Sat 12/27/2015, Print    lactulose (CHRONULAC) 10 GM/15ML solution Take 45 mLs (30 g total) by mouth 2 (two) times daily., Starting Fri 12/26/2015, Print    metoprolol tartrate (LOPRESSOR) 25 MG tablet Take 1 tablet (25 mg total) by mouth 2 (two) times daily., Starting Fri 12/26/2015, Print    Multiple Vitamin (MULTIVITAMIN WITH MINERALS) TABS tablet Take 1 tablet by mouth daily., Starting Sat 12/27/2015, Print    prednisoLONE (ORAPRED) 15 MG/5ML solution 40mg  (13.56ml)po daily for 25 days, then 30mg  (10ml) po daily for two days; then 20 (5.69ml)mg daily for two days; then 10 mg (3.8ml)po daily for two days then 5 mg (1.40ml)po daily for two days, Print        Today   VITAL SIGNS:  Blood pressure 129/71, pulse 79, temperature 98 F (36.7 C), resp. rate 18, height 6' (1.829 m), weight 89.4 kg (197 lb), SpO2 98 %.  I/O:  No intake or output data in the 24 hours ending 01/15/16 1040  PHYSICAL EXAMINATION:  Physical Exam  GENERAL:  55 y.o.-year-old patient lying in the bed lying in the bed with no acute distress.  LUNGS: Normal breath sounds bilaterally, no wheezing, rales,rhonchi or crepitation. No use of accessory muscles of respiration.  CARDIOVASCULAR: S1, S2 normal. No murmurs, rubs, or gallops.  ABDOMEN: Soft, non-tender, non-distended. Bowel sounds present. No organomegaly or mass.  NEUROLOGIC: Moves all 4 extremities. PSYCHIATRIC: The patient is alert and oriented x 3.  SKIN: No obvious rash, lesion, or ulcer.   DATA REVIEW:   CBC  Recent Labs Lab 01/13/16 0445  WBC 11.0*  HGB 7.9*  HCT 23.4*  PLT 59*    Chemistries   Recent Labs Lab 01/12/16 1212 01/13/16 0445 01/13/16 1457  NA  --  134*  --   K  --  2.9* 4.1  CL  --  103  --   CO2  --  23  --   GLUCOSE  --  159*  --   BUN   --  20  --   CREATININE  --  0.98  --   CALCIUM  --  8.3*  --   MG 1.9  --   --   AST 129* 101*  --   ALT 81* 70*  --   ALKPHOS 172* 167*  --   BILITOT 6.9* 6.9*  --     Cardiac Enzymes  Recent Labs Lab 01/12/16 1212  TROPONINI 0.09*    Microbiology Results  No results found for this or any previous visit.  RADIOLOGY:  No results found.  Follow up with PCP in 1 week.  Management plans discussed with the patient, family and they are in agreement.  CODE STATUS:  Code Status History    Date Active Date Inactive Code Status Order ID Comments User Context   01/11/2016 11:21 PM  01/13/2016  7:27 PM Full Code [id number]  Oralia Manisavid Willis, MD Inpatient   12/22/2015  8:31 PM 12/26/2015  6:53 PM Full Code [id number]  Milagros LollSrikar Mehtab Dolberry, MD ED   12/22/2015  4:27 PM 12/22/2015  8:30 PM Full Code [id number]  Willy EddyPatrick Robinson, MD ED      TOTAL TIME TAKING CARE OF THIS PATIENT ON DAY OF DISCHARGE: more than 30 minutes.   Milagros LollSudini, Javanna Patin R M.D on 01/15/2016 at 10:40 AM  Between 7am to 6pm - Pager - [phone]  After 6pm go to [email or web address] - password [email or web address] [facility]  [facility]  Office  [phone]  CC: Primary care physician; [facility]  Note: This dictation was prepared with Dragon dictation along with smaller phrase technology. Any transcriptional errors that result from this process are unintentional.

## 2016-01-21 ENCOUNTER — Encounter: Payer: Self-pay | Admitting: Gastroenterology

## 2016-01-21 ENCOUNTER — Other Ambulatory Visit
Admission: RE | Admit: 2016-01-21 | Discharge: 2016-01-21 | Disposition: A | Discharge status: Home / Self Care | Payer: No Typology Code available for payment source | Source: Ambulatory Visit | Attending: Gastroenterology | Admitting: Gastroenterology

## 2016-01-21 ENCOUNTER — Other Ambulatory Visit: Payer: Self-pay

## 2016-01-21 ENCOUNTER — Ambulatory Visit (INDEPENDENT_AMBULATORY_CARE_PROVIDER_SITE_OTHER): Payer: Self-pay | Admitting: Gastroenterology

## 2016-01-21 VITALS — BP 140/83 | HR 80 | Temp 98.2°F | Ht 72.0 in | Wt 190.0 lb

## 2016-01-21 DIAGNOSIS — D62 Acute posthemorrhagic anemia: Secondary | ICD-10-CM | POA: Insufficient documentation

## 2016-01-21 DIAGNOSIS — K625 Hemorrhage of anus and rectum: Secondary | ICD-10-CM | POA: Insufficient documentation

## 2016-01-21 LAB — CBC WITH DIFFERENTIAL/PLATELET
BASOS PCT: 2 %
Basophils Absolute: 0.2 10*3/uL — ABNORMAL HIGH (ref 0–0.1)
Eosinophils Absolute: 0.2 10*3/uL (ref 0–0.7)
Eosinophils Relative: 2 %
HEMATOCRIT: 31.3 % — AB (ref 40.0–52.0)
HEMOGLOBIN: 10.3 g/dL — AB (ref 13.0–18.0)
LYMPHS ABS: 2.3 10*3/uL (ref 1.0–3.6)
LYMPHS PCT: 24 %
MCH: 32.5 pg (ref 26.0–34.0)
MCHC: 32.8 g/dL (ref 32.0–36.0)
MCV: 99.2 fL (ref 80.0–100.0)
MONO ABS: 0.9 10*3/uL (ref 0.2–1.0)
Monocytes Relative: 10 %
NEUTROS PCT: 62 %
Neutro Abs: 6 10*3/uL (ref 1.4–6.5)
Platelets: 196 10*3/uL (ref 150–440)
RBC: 3.16 MIL/uL — ABNORMAL LOW (ref 4.40–5.90)
RDW: 19 % — AB (ref 11.5–14.5)
WBC: 9.6 10*3/uL (ref 3.8–10.6)

## 2016-01-21 MED ORDER — PEG 3350-KCL-NABCB-NACL-NASULF 236 G PO SOLR: ORAL | 0 refills | Status: DC
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
## 2016-01-23 ENCOUNTER — Telehealth: Payer: Self-pay

## 2016-01-23 NOTE — Telephone Encounter (Signed)
Pt notified of CBC results.  

## 2016-01-23 NOTE — Telephone Encounter (Signed)
-----   Message from Midge Miniumarren Wohl, MD sent at 01/22/2016  3:47 PM EDT ----- Tell the patient that the hemoglobin has come up significantly but is not yet normal.

## 2016-01-26 ENCOUNTER — Encounter: Payer: Self-pay | Admitting: Physician Assistant

## 2016-01-26 ENCOUNTER — Ambulatory Visit (INDEPENDENT_AMBULATORY_CARE_PROVIDER_SITE_OTHER): Payer: Self-pay | Admitting: Physician Assistant

## 2016-01-26 VITALS — BP 110/78 | HR 96 | Ht 72.0 in | Wt 184.0 lb

## 2016-01-26 DIAGNOSIS — I5032 Chronic diastolic (congestive) heart failure: Secondary | ICD-10-CM

## 2016-01-26 DIAGNOSIS — K7200 Acute and subacute hepatic failure without coma: Secondary | ICD-10-CM

## 2016-01-26 DIAGNOSIS — I471 Supraventricular tachycardia: Secondary | ICD-10-CM

## 2016-01-26 DIAGNOSIS — D638 Anemia in other chronic diseases classified elsewhere: Secondary | ICD-10-CM

## 2016-01-26 DIAGNOSIS — F1910 Other psychoactive substance abuse, uncomplicated: Secondary | ICD-10-CM

## 2016-01-26 DIAGNOSIS — D696 Thrombocytopenia, unspecified: Secondary | ICD-10-CM

## 2016-01-26 DIAGNOSIS — K7031 Alcoholic cirrhosis of liver with ascites: Secondary | ICD-10-CM

## 2016-01-26 MED ORDER — CITALOPRAM HYDROBROMIDE 10 MG PO TABS: 10.0000 mg | ORAL_TABLET | Freq: Every day | ORAL | 0 refills | Status: DC
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
## 2016-01-26 NOTE — Patient Instructions (Signed)
Medication Instructions:  Your physician has recommended you make the following change in your medication:  1. Celexa was sent in for 7 days and further refills will need to be done by your primary care physician.   Labwork: Labs done today. We will call you with results.   Follow-Up: Your physician recommends that you schedule a follow-up appointment in: 3 months with Dr. Kirke CorinArida.   [facility] Their number is [phone]. Someone should be calling you with this information in the next few days. If you have any problems please give us a call.  It was a pleasure seeing you today here in the office. Please do not hesitate to give us a call back if you have any further questions. [phone]  Jasper CellarPamela A. RN, BSN

## 2016-01-27 LAB — CBC WITH DIFFERENTIAL/PLATELET
BASOS ABS: 0.1 10*3/uL (ref 0.0–0.2)
BASOS: 1 %
EOS (ABSOLUTE): 0.3 10*3/uL (ref 0.0–0.4)
EOS: 2 %
HEMATOCRIT: 32.9 % — AB (ref 37.5–51.0)
HEMOGLOBIN: 11.3 g/dL — AB (ref 12.6–17.7)
IMMATURE GRANS (ABS): 0.2 10*3/uL — AB (ref 0.0–0.1)
Immature Granulocytes: 1 %
LYMPHS ABS: 3.2 10*3/uL — AB (ref 0.7–3.1)
LYMPHS: 22 %
MCH: 32.3 pg (ref 26.6–33.0)
MCHC: 34.3 g/dL (ref 31.5–35.7)
MCV: 94 fL (ref 79–97)
MONOCYTES: 8 %
Monocytes Absolute: 1.2 10*3/uL — ABNORMAL HIGH (ref 0.1–0.9)
NEUTROS ABS: 9.5 10*3/uL — AB (ref 1.4–7.0)
Neutrophils: 66 %
Platelets: 327 10*3/uL (ref 150–379)
RBC: 3.5 x10E6/uL — ABNORMAL LOW (ref 4.14–5.80)
RDW: 17.2 % — ABNORMAL HIGH (ref 12.3–15.4)
WBC: 14.6 10*3/uL — ABNORMAL HIGH (ref 3.4–10.8)

## 2016-01-27 LAB — BASIC METABOLIC PANEL
BUN / CREAT RATIO: 12 (ref 9–20)
BUN: 26 mg/dL — AB (ref 6–24)
CO2: 16 mmol/L — ABNORMAL LOW (ref 18–29)
CREATININE: 2.2 mg/dL — AB (ref 0.76–1.27)
Calcium: 9.5 mg/dL (ref 8.7–10.2)
Chloride: 96 mmol/L (ref 96–106)
GFR calc Af Amer: 38 mL/min/{1.73_m2} — ABNORMAL LOW (ref >59)
GFR, EST NON AFRICAN AMERICAN: 33 mL/min/{1.73_m2} — AB (ref >59)
GLUCOSE: 132 mg/dL — AB (ref 65–99)
Potassium: 4.4 mmol/L (ref 3.5–5.2)
SODIUM: 136 mmol/L (ref 134–144)

## 2016-02-02 ENCOUNTER — Encounter: Payer: Self-pay | Admitting: *Deleted

## 2016-02-03 ENCOUNTER — Encounter: Payer: Self-pay | Admitting: Anesthesiology

## 2016-02-04 ENCOUNTER — Other Ambulatory Visit: Payer: Self-pay

## 2016-02-09 ENCOUNTER — Ambulatory Visit: Admit: 2016-02-09 | Payer: Self-pay | Admitting: Gastroenterology

## 2016-02-09 HISTORY — DX: Anesthesia of skin: R20.0

## 2016-02-09 HISTORY — DX: Gastro-esophageal reflux disease without esophagitis: K21.9

## 2016-02-09 SURGERY — COLONOSCOPY WITH PROPOFOL
Anesthesia: Choice

## 2016-02-11 ENCOUNTER — Other Ambulatory Visit: Payer: Self-pay

## 2016-02-11 MED ORDER — NA SULFATE-K SULFATE-MG SULF 17.5-3.13-1.6 GM/177ML PO SOLN: 1.0000 | Freq: Once | ORAL | 0 refills | Status: AC

## 2016-02-13 ENCOUNTER — Ambulatory Visit: Payer: Self-pay | Admitting: Anesthesiology

## 2016-02-13 ENCOUNTER — Encounter: Payer: Self-pay | Admitting: *Deleted

## 2016-02-13 ENCOUNTER — Ambulatory Visit
Admission: RE | Admit: 2016-02-13 | Discharge: 2016-02-13 | Disposition: A | Discharge status: Home / Self Care | Payer: Self-pay | Source: Ambulatory Visit | Attending: Gastroenterology | Admitting: Gastroenterology

## 2016-02-13 ENCOUNTER — Encounter
Admission: RE | Disposition: A | Discharge status: Home / Self Care | Payer: Self-pay | Source: Ambulatory Visit | Attending: Gastroenterology

## 2016-02-13 DIAGNOSIS — K7290 Hepatic failure, unspecified without coma: Secondary | ICD-10-CM | POA: Insufficient documentation

## 2016-02-13 DIAGNOSIS — K3189 Other diseases of stomach and duodenum: Secondary | ICD-10-CM | POA: Insufficient documentation

## 2016-02-13 DIAGNOSIS — K5730 Diverticulosis of large intestine without perforation or abscess without bleeding: Secondary | ICD-10-CM

## 2016-02-13 DIAGNOSIS — K766 Portal hypertension: Secondary | ICD-10-CM | POA: Insufficient documentation

## 2016-02-13 DIAGNOSIS — K640 First degree hemorrhoids: Secondary | ICD-10-CM

## 2016-02-13 DIAGNOSIS — I110 Hypertensive heart disease with heart failure: Secondary | ICD-10-CM | POA: Insufficient documentation

## 2016-02-13 DIAGNOSIS — D649 Anemia, unspecified: Secondary | ICD-10-CM | POA: Insufficient documentation

## 2016-02-13 DIAGNOSIS — I509 Heart failure, unspecified: Secondary | ICD-10-CM | POA: Insufficient documentation

## 2016-02-13 DIAGNOSIS — K7460 Unspecified cirrhosis of liver: Secondary | ICD-10-CM | POA: Insufficient documentation

## 2016-02-13 DIAGNOSIS — K219 Gastro-esophageal reflux disease without esophagitis: Secondary | ICD-10-CM | POA: Insufficient documentation

## 2016-02-13 DIAGNOSIS — Z87891 Personal history of nicotine dependence: Secondary | ICD-10-CM | POA: Insufficient documentation

## 2016-02-13 DIAGNOSIS — Z79899 Other long term (current) drug therapy: Secondary | ICD-10-CM | POA: Insufficient documentation

## 2016-02-13 DIAGNOSIS — K921 Melena: Secondary | ICD-10-CM | POA: Insufficient documentation

## 2016-02-13 DIAGNOSIS — F419 Anxiety disorder, unspecified: Secondary | ICD-10-CM | POA: Insufficient documentation

## 2016-02-13 HISTORY — PX: ESOPHAGOGASTRODUODENOSCOPY (EGD) WITH PROPOFOL: SHX5813

## 2016-02-13 HISTORY — PX: COLONOSCOPY WITH PROPOFOL: SHX5780

## 2016-02-13 SURGERY — COLONOSCOPY WITH PROPOFOL
Anesthesia: General

## 2016-02-13 MED ORDER — MIDAZOLAM HCL 2 MG/2ML IJ SOLN
INTRAMUSCULAR | Status: DC | PRN
  Administered 2016-02-13: 1 mg via INTRAVENOUS

## 2016-02-13 MED ORDER — PROPOFOL 500 MG/50ML IV EMUL
INTRAVENOUS | Status: DC | PRN
  Administered 2016-02-13: 120 ug/kg/min via INTRAVENOUS

## 2016-02-13 MED ORDER — SODIUM CHLORIDE 0.9 % IV SOLN
INTRAVENOUS | Status: DC
  Administered 2016-02-13: 11:00:00 via INTRAVENOUS

## 2016-02-13 MED ORDER — PROPOFOL 10 MG/ML IV BOLUS
INTRAVENOUS | Status: DC | PRN
  Administered 2016-02-13: 40 mg via INTRAVENOUS

## 2016-02-13 MED ORDER — FENTANYL CITRATE (PF) 100 MCG/2ML IJ SOLN
INTRAMUSCULAR | Status: DC | PRN
  Administered 2016-02-13: 50 ug via INTRAVENOUS

## 2016-02-13 NOTE — Transfer of Care (Signed)
Immediate Anesthesia Transfer of Care Note  Patient: Ryan PolioMichael J Howe  Procedure(s) Performed: Procedure(s): COLONOSCOPY WITH PROPOFOL (N/A) ESOPHAGOGASTRODUODENOSCOPY (EGD) WITH PROPOFOL (N/A)  Patient Location: PACU  Anesthesia Type:General  Level of Consciousness: awake  Airway & Oxygen Therapy: Patient Spontanous Breathing and Patient connected to nasal cannula oxygen  Post-op Assessment: Report given to RN and Post -op Vital signs reviewed and stable  Post vital signs: Reviewed  Last Vitals:  Vitals:   02/13/16 1017  BP: 134/68  Pulse: 77  Resp: 16  Temp: 36.4 C    Last Pain:  Vitals:   02/13/16 1017  TempSrc: Tympanic         Complications: No apparent anesthesia complications

## 2016-02-13 NOTE — Op Note (Signed)
[facility] Gastroenterology Patient Name: Ryan Howe Procedure Date: 02/13/2016 11:11 AM MRN: [id number] Account #: [id number] Date of Birth: 1960/07/26 Admit Type: Outpatient Age: 55 Room: [facility] ENDO ROOM 3 Gender: Male Note Status: Finalized Procedure:            Colonoscopy Indications:          Melena Providers:            Wyline MoodKiran Hobie Kohles MD, MD Referring MD:         No Local Md, MD (Referring MD) Medicines:            Monitored Anesthesia Care Complications:        No immediate complications. Procedure:            Pre-Anesthesia Assessment:                       - ASA Grade Assessment: III - A patient with severe                        systemic disease.                       - Prior to the procedure, a History and Physical was                        performed, and patient medications, allergies and                        sensitivities were reviewed. The patient's tolerance of                        previous anesthesia was reviewed.                       - The risks and benefits of the procedure and the                        sedation options and risks were discussed with the                        patient. All questions were answered and informed                        consent was obtained.                       After obtaining informed consent, the colonoscope was                        passed under direct vision. Throughout the procedure,                        the patient's blood pressure, pulse, and oxygen                        saturations were monitored continuously. The                        Colonoscope was introduced through the anus with the                        intention  of advancing to the cecum. The scope was                        advanced to the sigmoid colon before the procedure was                        aborted. Medications were given. The colonoscopy was                        performed with ease. The patient tolerated the             procedure well. The quality of the bowel preparation                        was inadequate. The colonoscopy was aborted due to                        inadequate bowel prep. Findings:      The perianal and digital rectal examinations were normal.      A large amount of semi-solid stool was found in the sigmoid colon,       precluding visualization. Impression:           - Preparation of the colon was inadequate.                       - The procedure was aborted due to inadequate bowel                        prep.                       - Stool in the sigmoid colon.                       - No specimens collected. Recommendation:       - Discharge patient to home.                       - Resume previous diet today.                       - Patient has a contact number available for                        emergencies. The signs and symptoms of potential                        delayed complications were discussed with the patient.                        Return to normal activities tomorrow. Written discharge                        instructions were provided to the patient. Procedure Code(s):    --- Professional ---                       [id number], 53, Colonoscopy, flexible; diagnostic, including                        collection of specimen(s) by brushing or washing, when  performed (separate procedure) Diagnosis Code(s):    --- Professional ---                       K92.1, Melena (includes Hematochezia)                       Z53.8, Procedure and treatment not carried out for                        other reasons CPT copyright 2016 American Medical Association. All rights reserved. The codes documented in this report are preliminary and upon coder review may  be revised to meet current compliance requirements. Wyline Mood, MD Wyline Mood MD, MD 02/13/2016 11:33:00 AM This report has been signed electronically. Number of Addenda: 0 Note Initiated On: 02/13/2016  11:11 AM Total Procedure Duration: 0 hours 2 minutes 21 seconds       [facility]

## 2016-02-13 NOTE — H&P (Signed)
Ryan MoodKiran Albaro Deviney MD [address] [place], [place] [address] Phone: [phone] Fax : [phone]  Primary Care Physician:  [facility] Primary Gastroenterologist:  Dr. Wyline MoodKiran Nea Howe   Pre-Procedure History & Physical: HPI:  Ryan PolioMichael J Howe is a 55 y.o. male is here for an endoscopy and colonoscopy.   Past Medical History:  Diagnosis Date  . Anemia of chronic disease   . Anxiety   . AVNRT (AV nodal re-entry tachycardia) (HCC)   . Cirrhosis (HCC)   . GERD (gastroesophageal reflux disease)   . Hypercholesteremia   . Hypertension   . Liver failure (HCC)   . Numbness of toes   . Pancreatitis   . Polysubstance abuse    a. former cocaine and tobacco abuse, ongoing alcohol and marijuana abuse  . Thrombocytopenia (HCC)     Past Surgical History:  Procedure Laterality Date  . NASAL RECONSTRUCTION WITH SEPTAL REPAIR      Prior to Admission medications   Medication Sig Start Date End Date Taking? Authorizing Provider  citalopram (CELEXA) 10 MG tablet Take 1 tablet (10 mg total) by mouth daily. 01/26/16  Yes Antonieta Ibaimothy J Gollan, MD  ferrous sulfate 325 (65 FE) MG tablet Take 1 tablet (325 mg total) by mouth 2 (two) times daily with a meal. 01/13/16  Yes Srikar Sudini, MD  folic acid (FOLVITE) 1 MG tablet Take 1 tablet (1 mg total) by mouth daily. 12/27/15  Yes Alford Highlandichard Wieting, MD  furosemide (LASIX) 40 MG tablet Take 1 tablet (40 mg total) by mouth daily. 01/13/16  Yes Srikar Sudini, MD  lactulose (CHRONULAC) 10 GM/15ML solution Take 45 mLs (30 g total) by mouth 2 (two) times daily. 12/26/15  Yes Richard Renae GlossWieting, MD  MELATONIN PO Take 18 mg by mouth.   Yes Historical Provider, MD  metoprolol tartrate (LOPRESSOR) 25 MG tablet Take 1 tablet (25 mg total) by mouth 2 (two) times daily. 12/26/15  Yes Alford Highlandichard Wieting, MD  Multiple Vitamin (MULTIVITAMIN WITH MINERALS) TABS tablet Take 1 tablet by mouth daily. 12/27/15  Yes Richard Renae GlossWieting, MD  pantoprazole (PROTONIX) 40 MG  tablet Take 1 tablet (40 mg total) by mouth daily. 01/13/16  Yes Srikar Sudini, MD  polyethylene glycol (GOLYTELY) 236 g solution Drink one 8 oz glass every 20 mins until stools are clear. 01/21/16  Yes Midge Miniumarren Wohl, MD  potassium chloride 20 MEQ TBCR Take 20 mEq by mouth daily. 01/13/16  Yes Srikar Sudini, MD  spironolactone (ALDACTONE) 50 MG tablet Take 1 tablet (50 mg total) by mouth daily. 01/13/16  Yes Srikar Sudini, MD  citalopram (CELEXA) 10 MG tablet Take 1 tablet (10 mg total) by mouth at bedtime. 12/26/15   Alford Highlandichard Wieting, MD  feeding supplement, ENSURE ENLIVE, (ENSURE ENLIVE) LIQD Take 237 mLs by mouth 2 (two) times daily between meals. 12/26/15   Alford Highlandichard Wieting, MD  prednisoLONE (ORAPRED) 15 MG/5ML solution 40mg  (13.83ml)po daily for 25 days, then 30mg  (10ml) po daily for two days; then 20 (5.753ml)mg daily for two days; then 10 mg (3.573ml)po daily for two days then 5 mg (1.516ml)po daily for two days Patient not taking: Reported on 02/02/2016 12/26/15   Alford Highlandichard Wieting, MD    Allergies as of 02/04/2016  . (No Known Allergies)    Family History  Problem Relation Age of Onset  . Pancreatitis Brother   . Heart failure Father     Social History   Social History  . Marital status: Single    Spouse name: N/A  . Number  of children: N/A  . Years of education: N/A   Occupational History  . Not on file.   Social History Main Topics  . Smoking status: Former Smoker    Quit date: 04/06/1999  . Smokeless tobacco: Never Used  . Alcohol use 4.8 oz/week    8 Cans of beer per week     Comment: pt reports quit 01/22/16, was drinking heavily  . Drug use:     Frequency: 2.0 times per week    Types: Marijuana     Comment: former cocaine user  . Sexual activity: Not on file   Other Topics Concern  . Not on file   Social History Narrative  . No narrative on file    Review of Systems: See HPI, otherwise negative ROS  Physical Exam: BP 134/68   Pulse 77   Temp 97.5 F (36.4 C)  (Tympanic)   Resp 16   Ht 6' (1.829 m)   Wt 184 lb (83.5 kg)   SpO2 97%   BMI 24.95 kg/m  General:   Alert,  pleasant and cooperative in NAD Head:  Normocephalic and atraumatic. Neck:  Supple; no masses or thyromegaly. Lungs:  Clear throughout to auscultation.    Heart:  Regular rate and rhythm. Abdomen:  Soft, nontender and nondistended. Normal bowel sounds, without guarding, and without rebound.   Neurologic:  Alert and  oriented x4;  grossly normal neurologically.  Impression/Plan: Ryan Howe is here for an endoscopy and colonoscopy to be performed for GI bleeding,anemia   Risks, benefits, limitations, and alternatives regarding  endoscopy and colonoscopy have been reviewed with the patient.  Questions have been answered.  All parties agreeable.   Ryan MoodKiran Dyrell Tuccillo, MD  02/13/2016, 10:35 AM

## 2016-02-13 NOTE — Anesthesia Postprocedure Evaluation (Signed)
Anesthesia Post Note  Patient: Ryan Howe  Procedure(s) Performed: Procedure(s) (LRB): COLONOSCOPY WITH PROPOFOL (N/A) ESOPHAGOGASTRODUODENOSCOPY (EGD) WITH PROPOFOL (N/A)  Patient location during evaluation: PACU Anesthesia Type: General Level of consciousness: awake Pain management: pain level controlled Vital Signs Assessment: post-procedure vital signs reviewed and stable Respiratory status: spontaneous breathing Cardiovascular status: stable Anesthetic complications: no    Last Vitals:  Vitals:   02/13/16 1150 02/13/16 1200  BP: 96/68 109/68  Pulse:    Resp:    Temp:      Last Pain:  Vitals:   02/13/16 1130  TempSrc: Tympanic                 VAN STAVEREN,Fritzi Scripter

## 2016-02-13 NOTE — Op Note (Signed)
[facility] Gastroenterology Patient Name: Ryan Howe Procedure Date: 02/13/2016 11:12 AM MRN: [id number] Account #: [id number] Date of Birth: July 05, 1960 Admit Type: Outpatient Age: 55 [facility]OM 3 Gender: Male Note Status: Finalized Procedure:            Upper GI endoscopy Indications:          Melena Providers:            Wyline MoodKiran Alesha Jaffee MD, MD Referring MD:         No Local Md, MD (Referring MD) Medicines:            Monitored Anesthesia Care Complications:        No immediate complications. Procedure:            Pre-Anesthesia Assessment:                       - Prior to the procedure, a History and Physical was                        performed, and patient medications, allergies and                        sensitivities were reviewed. The patient's tolerance of                        previous anesthesia was reviewed.                       - The risks and benefits of the procedure and the                        sedation options and risks were discussed with the                        patient. All questions were answered and informed                        consent was obtained.                       - ASA Grade Assessment: III - A patient with severe                        systemic disease.                       After obtaining informed consent, the endoscope was                        passed under direct vision. Throughout the procedure,                        the patient's blood pressure, pulse, and oxygen                        saturations were monitored continuously. The Endoscope                        was introduced through the mouth, and advanced to the  third part of duodenum. The upper GI endoscopy was                        accomplished with ease. The patient tolerated the                        procedure well. Findings:      The esophagus was normal.      The examined duodenum was normal.      Moderate portal  hypertensive gastropathy was found in the entire       examined stomach. Impression:           - Normal esophagus.                       - Normal examined duodenum.                       - Portal hypertensive gastropathy.                       - No specimens collected. Recommendation:       - Discharge patient to home (with escort).                       - Resume previous diet today.                       - Return to GI office in 4 weeks. Procedure Code(s):    --- Professional ---                       [phone], Esophagogastroduodenoscopy, flexible, transoral;                        diagnostic, including collection of specimen(s) by                        brushing or washing, when performed (separate procedure) Diagnosis Code(s):    --- Professional ---                       K92.1, Melena (includes Hematochezia)                       K31.89, Other diseases of stomach and duodenum                       K76.6, Portal hypertension CPT copyright 2016 American Medical Association. All rights reserved. The codes documented in this report are preliminary and upon coder review may  be revised to meet current compliance requirements. Wyline MoodKiran Lenette Rau, MD Wyline MoodKiran Jeannetta Cerutti MD, MD 02/13/2016 11:26:33 AM This report has been signed electronically. Number of Addenda: 0 Note Initiated On: 02/13/2016 11:12 AM      [facility]

## 2016-02-13 NOTE — Anesthesia Preprocedure Evaluation (Signed)
Anesthesia Evaluation  Patient identified by MRN, date of birth, ID band Patient awake    Reviewed: Allergy & Precautions, NPO status , Patient's Chart, lab work & pertinent test results  Airway Mallampati: II       Dental  (+) Teeth Intact   Pulmonary former smoker,     + decreased breath sounds      Cardiovascular Exercise Tolerance: Good hypertension, Pt. on home beta blockers +CHF   Rhythm:Regular Rate:Normal     Neuro/Psych Anxiety negative neurological ROS     GI/Hepatic Neg liver ROS, GERD  Medicated,  Endo/Other  negative endocrine ROS  Renal/GU negative Renal ROS     Musculoskeletal   Abdominal Normal abdominal exam  (+)   Peds  Hematology  (+) anemia ,   Anesthesia Other Findings   Reproductive/Obstetrics                             Anesthesia Physical Anesthesia Plan  ASA: III  Anesthesia Plan: General   Post-op Pain Management:    Induction: Intravenous  Airway Management Planned: Natural Airway and Nasal Cannula  Additional Equipment:   Intra-op Plan:   Post-operative Plan:   Informed Consent: I have reviewed the patients History and Physical, chart, labs and discussed the procedure including the risks, benefits and alternatives for the proposed anesthesia with the patient or authorized representative who has indicated his/her understanding and acceptance.     Plan Discussed with: Anesthesiologist and Surgeon  Anesthesia Plan Comments:         Anesthesia Quick Evaluation

## 2016-02-13 NOTE — OR Nursing (Signed)
Poor Prep incomplete Colonoscopy. Got to sigmoid colon.

## 2016-02-16 ENCOUNTER — Other Ambulatory Visit: Payer: Self-pay

## 2016-02-16 ENCOUNTER — Encounter: Payer: Self-pay | Admitting: Gastroenterology

## 2016-02-23 ENCOUNTER — Other Ambulatory Visit: Payer: Self-pay

## 2016-02-23 ENCOUNTER — Telehealth: Payer: Self-pay | Admitting: *Deleted

## 2016-02-23 ENCOUNTER — Other Ambulatory Visit
Admission: RE | Admit: 2016-02-23 | Discharge: 2016-02-23 | Disposition: A | Discharge status: Home / Self Care | Payer: Self-pay | Source: Ambulatory Visit | Attending: Physician Assistant | Admitting: Physician Assistant

## 2016-02-23 DIAGNOSIS — N179 Acute kidney failure, unspecified: Secondary | ICD-10-CM

## 2016-02-23 LAB — BASIC METABOLIC PANEL
ANION GAP: 8 (ref 5–15)
BUN: 6 mg/dL (ref 6–20)
CO2: 21 mmol/L — ABNORMAL LOW (ref 22–32)
Calcium: 8.3 mg/dL — ABNORMAL LOW (ref 8.9–10.3)
Chloride: 109 mmol/L (ref 101–111)
Creatinine, Ser: 1.09 mg/dL (ref 0.61–1.24)
Glucose, Bld: 228 mg/dL — ABNORMAL HIGH (ref 65–99)
POTASSIUM: 3.6 mmol/L (ref 3.5–5.1)
SODIUM: 138 mmol/L (ref 135–145)

## 2016-02-23 MED ORDER — FUROSEMIDE 40 MG PO TABS: 40.0000 mg | ORAL_TABLET | ORAL | 0 refills | Status: DC

## 2016-02-23 NOTE — Telephone Encounter (Signed)
Patient presented to check-in desk in lobby to pick up lab slips as he called this morning to get lab work. Patient stated he felt dizzy. He appeared anxious and was tearful. He said last night he had a terrible anxiety attack.  BP taken 132/81, HR 86, O2 97% on RA. Patient taken to medical mall for labs via wheelchair and accompanied by RN. Patient talkative and began to smile. Patient had questions about thinking he may Tourette's syndrome. Advised patient should f/u with his PCP for that. Patient had lab work drawn. Patient no longer experiencing dizziness, denies chest pain, SOB, nausea, sweating. Advised patient that if he started feeling this way again he should go directly to the ED or call 911. Patient verbalized understanding. Patient stated his ride was in the parking lot and he was fine and ready to go.  Patient taken to [facility] entrance in wheelchair and patient able to walk with no problems or dizziness. He was very Adult nurseappreciative.

## 2016-03-01 ENCOUNTER — Other Ambulatory Visit (INDEPENDENT_AMBULATORY_CARE_PROVIDER_SITE_OTHER): Payer: Self-pay

## 2016-03-01 DIAGNOSIS — N179 Acute kidney failure, unspecified: Secondary | ICD-10-CM

## 2016-03-02 ENCOUNTER — Other Ambulatory Visit: Payer: Self-pay

## 2016-03-02 ENCOUNTER — Telehealth: Payer: Self-pay

## 2016-03-02 ENCOUNTER — Ambulatory Visit: Admit: 2016-03-02 | Payer: MEDICAID | Admitting: Gastroenterology

## 2016-03-02 DIAGNOSIS — K625 Hemorrhage of anus and rectum: Secondary | ICD-10-CM

## 2016-03-02 LAB — BASIC METABOLIC PANEL
BUN / CREAT RATIO: 8 — AB (ref 9–20)
BUN: 8 mg/dL (ref 6–24)
CO2: 20 mmol/L (ref 18–29)
CREATININE: 0.99 mg/dL (ref 0.76–1.27)
Calcium: 8.9 mg/dL (ref 8.7–10.2)
Chloride: 102 mmol/L (ref 96–106)
GFR, EST AFRICAN AMERICAN: 98 mL/min/{1.73_m2} (ref >59)
GFR, EST NON AFRICAN AMERICAN: 85 mL/min/{1.73_m2} (ref >59)
Glucose: 189 mg/dL — ABNORMAL HIGH (ref 65–99)
Potassium: 4.1 mmol/L (ref 3.5–5.2)
Sodium: 139 mmol/L (ref 134–144)

## 2016-03-02 SURGERY — COLONOSCOPY WITH PROPOFOL
Anesthesia: General

## 2016-03-02 MED ORDER — PEG 3350-KCL-NABCB-NACL-NASULF 236 G PO SOLR: 4000.0000 mL | Freq: Once | ORAL | 0 refills | Status: AC

## 2016-03-02 NOTE — Telephone Encounter (Signed)
Pt rescheduled for a colonoscopy at [facility] on Friday, Dec 15th. Rx was sent to pharmacy and instructions mailed.

## 2016-03-18 ENCOUNTER — Telehealth: Payer: Self-pay

## 2016-03-18 NOTE — Telephone Encounter (Signed)
Patient wants to reschedule his colonoscopy for sometime in January.

## 2016-03-19 ENCOUNTER — Ambulatory Visit: Admit: 2016-03-19 | Payer: MEDICAID | Admitting: Gastroenterology

## 2016-03-19 SURGERY — COLONOSCOPY WITH PROPOFOL
Anesthesia: General

## 2016-04-27 ENCOUNTER — Encounter: Payer: Self-pay | Admitting: Emergency Medicine

## 2016-04-27 ENCOUNTER — Inpatient Hospital Stay
Admission: EM | Admit: 2016-04-27 | Discharge: 2016-05-04 | DRG: 871 | Disposition: A | Discharge status: Hospice | Payer: BLUE CROSS/BLUE SHIELD | Attending: Internal Medicine | Admitting: Internal Medicine

## 2016-04-27 ENCOUNTER — Ambulatory Visit: Payer: Self-pay | Admitting: Cardiovascular Disease

## 2016-04-27 DIAGNOSIS — K7040 Alcoholic hepatic failure without coma: Secondary | ICD-10-CM | POA: Diagnosis present

## 2016-04-27 DIAGNOSIS — K7290 Hepatic failure, unspecified without coma: Secondary | ICD-10-CM

## 2016-04-27 DIAGNOSIS — Z978 Presence of other specified devices: Secondary | ICD-10-CM

## 2016-04-27 DIAGNOSIS — R7989 Other specified abnormal findings of blood chemistry: Secondary | ICD-10-CM

## 2016-04-27 DIAGNOSIS — J9601 Acute respiratory failure with hypoxia: Secondary | ICD-10-CM | POA: Diagnosis present

## 2016-04-27 DIAGNOSIS — Z8249 Family history of ischemic heart disease and other diseases of the circulatory system: Secondary | ICD-10-CM

## 2016-04-27 DIAGNOSIS — E876 Hypokalemia: Secondary | ICD-10-CM | POA: Diagnosis present

## 2016-04-27 DIAGNOSIS — Y908 Blood alcohol level of 240 mg/100 ml or more: Secondary | ICD-10-CM | POA: Diagnosis present

## 2016-04-27 DIAGNOSIS — E785 Hyperlipidemia, unspecified: Secondary | ICD-10-CM | POA: Diagnosis present

## 2016-04-27 DIAGNOSIS — F1210 Cannabis abuse, uncomplicated: Secondary | ICD-10-CM | POA: Diagnosis present

## 2016-04-27 DIAGNOSIS — E7800 Pure hypercholesterolemia, unspecified: Secondary | ICD-10-CM | POA: Diagnosis present

## 2016-04-27 DIAGNOSIS — A419 Sepsis, unspecified organism: Secondary | ICD-10-CM | POA: Diagnosis present

## 2016-04-27 DIAGNOSIS — K219 Gastro-esophageal reflux disease without esophagitis: Secondary | ICD-10-CM | POA: Diagnosis present

## 2016-04-27 DIAGNOSIS — R0603 Acute respiratory distress: Secondary | ICD-10-CM | POA: Diagnosis present

## 2016-04-27 DIAGNOSIS — F10239 Alcohol dependence with withdrawal, unspecified: Secondary | ICD-10-CM | POA: Diagnosis present

## 2016-04-27 DIAGNOSIS — E872 Acidosis: Secondary | ICD-10-CM | POA: Diagnosis present

## 2016-04-27 DIAGNOSIS — K922 Gastrointestinal hemorrhage, unspecified: Secondary | ICD-10-CM | POA: Diagnosis present

## 2016-04-27 DIAGNOSIS — Z79899 Other long term (current) drug therapy: Secondary | ICD-10-CM | POA: Diagnosis not present

## 2016-04-27 DIAGNOSIS — K861 Other chronic pancreatitis: Secondary | ICD-10-CM | POA: Diagnosis present

## 2016-04-27 DIAGNOSIS — D638 Anemia in other chronic diseases classified elsewhere: Secondary | ICD-10-CM | POA: Diagnosis present

## 2016-04-27 DIAGNOSIS — I10 Essential (primary) hypertension: Secondary | ICD-10-CM | POA: Diagnosis present

## 2016-04-27 DIAGNOSIS — D696 Thrombocytopenia, unspecified: Secondary | ICD-10-CM | POA: Diagnosis present

## 2016-04-27 DIAGNOSIS — K7200 Acute and subacute hepatic failure without coma: Secondary | ICD-10-CM

## 2016-04-27 DIAGNOSIS — Z7189 Other specified counseling: Secondary | ICD-10-CM

## 2016-04-27 DIAGNOSIS — K7460 Unspecified cirrhosis of liver: Secondary | ICD-10-CM

## 2016-04-27 DIAGNOSIS — Z66 Do not resuscitate: Secondary | ICD-10-CM | POA: Diagnosis not present

## 2016-04-27 DIAGNOSIS — Z87891 Personal history of nicotine dependence: Secondary | ICD-10-CM | POA: Diagnosis not present

## 2016-04-27 DIAGNOSIS — R251 Tremor, unspecified: Secondary | ICD-10-CM | POA: Diagnosis present

## 2016-04-27 DIAGNOSIS — K7031 Alcoholic cirrhosis of liver with ascites: Secondary | ICD-10-CM | POA: Diagnosis present

## 2016-04-27 DIAGNOSIS — E7220 Disorder of urea cycle metabolism, unspecified: Secondary | ICD-10-CM | POA: Diagnosis not present

## 2016-04-27 DIAGNOSIS — Z515 Encounter for palliative care: Secondary | ICD-10-CM | POA: Diagnosis present

## 2016-04-27 DIAGNOSIS — Z4659 Encounter for fitting and adjustment of other gastrointestinal appliance and device: Secondary | ICD-10-CM

## 2016-04-27 LAB — AMMONIA: AMMONIA: 72 umol/L — AB (ref 9–35)

## 2016-04-27 LAB — COMPREHENSIVE METABOLIC PANEL
ALBUMIN: 2.9 g/dL — AB (ref 3.5–5.0)
ALK PHOS: 227 U/L — AB (ref 38–126)
ALT: 43 U/L (ref 17–63)
ANION GAP: 14 (ref 5–15)
AST: 247 U/L — ABNORMAL HIGH (ref 15–41)
BUN: 6 mg/dL (ref 6–20)
CALCIUM: 8 mg/dL — AB (ref 8.9–10.3)
CO2: 19 mmol/L — AB (ref 22–32)
Chloride: 104 mmol/L (ref 101–111)
GLUCOSE: 134 mg/dL — AB (ref 65–99)
Potassium: 3 mmol/L — ABNORMAL LOW (ref 3.5–5.1)
SODIUM: 137 mmol/L (ref 135–145)
Total Bilirubin: 31.3 mg/dL (ref 0.3–1.2)
Total Protein: 8 g/dL (ref 6.5–8.1)

## 2016-04-27 LAB — LIPASE, BLOOD: LIPASE: 114 U/L — AB (ref 11–51)

## 2016-04-27 LAB — PROCALCITONIN: Procalcitonin: 0.18 ng/mL

## 2016-04-27 LAB — APTT
APTT: 63 s — AB (ref 24–36)
aPTT: 66 seconds — ABNORMAL HIGH (ref 24–36)

## 2016-04-27 LAB — CBC
HCT: 27.3 % — ABNORMAL LOW (ref 40.0–52.0)
HEMOGLOBIN: 9.6 g/dL — AB (ref 13.0–18.0)
MCH: 33.2 pg (ref 26.0–34.0)
MCHC: 35.2 g/dL (ref 32.0–36.0)
MCV: 94.3 fL (ref 80.0–100.0)
Platelets: 135 10*3/uL — ABNORMAL LOW (ref 150–440)
RBC: 2.89 MIL/uL — ABNORMAL LOW (ref 4.40–5.90)
RDW: 22 % — ABNORMAL HIGH (ref 11.5–14.5)
WBC: 16.5 10*3/uL — ABNORMAL HIGH (ref 3.8–10.6)

## 2016-04-27 LAB — PROTIME-INR
INR: 1.7
INR: 1.73
Prothrombin Time: 20.2 seconds — ABNORMAL HIGH (ref 11.4–15.2)
Prothrombin Time: 20.5 seconds — ABNORMAL HIGH (ref 11.4–15.2)

## 2016-04-27 LAB — TYPE AND SCREEN
ABO/RH(D): A NEG
Antibody Screen: NEGATIVE

## 2016-04-27 LAB — LACTIC ACID, PLASMA
LACTIC ACID, VENOUS: 2.5 mmol/L — AB (ref 0.5–1.9)
Lactic Acid, Venous: 1.8 mmol/L (ref 0.5–1.9)

## 2016-04-27 LAB — CREATININE, SERUM

## 2016-04-27 LAB — ACETAMINOPHEN LEVEL: Acetaminophen (Tylenol), Serum: <10 ug/mL — ABNORMAL LOW (ref 10–30)

## 2016-04-27 LAB — ETHANOL: Alcohol, Ethyl (B): 332 mg/dL (ref <5)

## 2016-04-27 MED ORDER — SODIUM CHLORIDE 0.9 % IV BOLUS (SEPSIS)
1000.0000 mL | INTRAVENOUS | Status: AC
  Administered 2016-04-27: 1000 mL via INTRAVENOUS

## 2016-04-27 MED ORDER — ALBUTEROL SULFATE (2.5 MG/3ML) 0.083% IN NEBU: 2.5000 mg | INHALATION_SOLUTION | RESPIRATORY_TRACT | Status: DC | PRN

## 2016-04-27 MED ORDER — VITAMIN K1 10 MG/ML IJ SOLN
10.0000 mg | Freq: Every day | INTRAMUSCULAR | Status: DC
  Administered 2016-04-27: 10 mg via SUBCUTANEOUS
  Filled 2016-04-27 (×2): qty 1

## 2016-04-27 MED ORDER — LACTULOSE 10 GM/15ML PO SOLN
30.0000 g | Freq: Once | ORAL | Status: AC
  Administered 2016-04-27: 30 g via ORAL
  Filled 2016-04-27: qty 60

## 2016-04-27 MED ORDER — LORAZEPAM 1 MG PO TABS: 0.0000 mg | ORAL_TABLET | Freq: Two times a day (BID) | ORAL | Status: DC

## 2016-04-27 MED ORDER — OXYCODONE HCL 5 MG PO TABS: 5.0000 mg | ORAL_TABLET | ORAL | Status: DC | PRN

## 2016-04-27 MED ORDER — ONDANSETRON HCL 4 MG/2ML IJ SOLN
4.0000 mg | Freq: Four times a day (QID) | INTRAMUSCULAR | Status: DC | PRN
  Administered 2016-04-29: 4 mg via INTRAVENOUS
  Filled 2016-04-27: qty 2

## 2016-04-27 MED ORDER — VANCOMYCIN HCL IN DEXTROSE 1-5 GM/200ML-% IV SOLN
1000.0000 mg | Freq: Once | INTRAVENOUS | Status: DC
  Filled 2016-04-27: qty 200

## 2016-04-27 MED ORDER — PIPERACILLIN-TAZOBACTAM 3.375 G IVPB 30 MIN
3.3750 g | Freq: Once | INTRAVENOUS | Status: AC
  Administered 2016-04-27: 3.375 g via INTRAVENOUS
  Filled 2016-04-27 (×2): qty 50

## 2016-04-27 MED ORDER — HEPARIN SODIUM (PORCINE) 5000 UNIT/ML IJ SOLN
5000.0000 [IU] | Freq: Three times a day (TID) | INTRAMUSCULAR | Status: DC
  Filled 2016-04-27: qty 1

## 2016-04-27 MED ORDER — THIAMINE HCL 100 MG/ML IJ SOLN
100.0000 mg | Freq: Every day | INTRAMUSCULAR | Status: DC
  Administered 2016-04-28 – 2016-04-30 (×3): 100 mg via INTRAVENOUS
  Filled 2016-04-27 (×3): qty 2

## 2016-04-27 MED ORDER — FOLIC ACID 5 MG/ML IJ SOLN
1.0000 mg | Freq: Every day | INTRAMUSCULAR | Status: DC
  Administered 2016-04-27 – 2016-04-30 (×4): 1 mg via INTRAVENOUS
  Filled 2016-04-27 (×7): qty 0.2

## 2016-04-27 MED ORDER — LACTULOSE 10 GM/15ML PO SOLN
30.0000 g | Freq: Three times a day (TID) | ORAL | Status: DC
  Administered 2016-04-27: 30 g via ORAL
  Filled 2016-04-27: qty 60

## 2016-04-27 MED ORDER — SODIUM CHLORIDE 0.9% FLUSH
3.0000 mL | Freq: Two times a day (BID) | INTRAVENOUS | Status: DC
  Administered 2016-04-28 – 2016-05-03 (×9): 3 mL via INTRAVENOUS

## 2016-04-27 MED ORDER — LORAZEPAM 1 MG PO TABS
0.0000 mg | ORAL_TABLET | Freq: Four times a day (QID) | ORAL | Status: DC
Start: 2016-04-27 — End: 2016-04-28
  Administered 2016-04-27: 2 mg via ORAL
  Administered 2016-04-28 (×2): 4 mg via ORAL
  Filled 2016-04-27: qty 4
  Filled 2016-04-27: qty 1
  Filled 2016-04-27: qty 4

## 2016-04-27 MED ORDER — SODIUM CHLORIDE 0.9 % IV BOLUS (SEPSIS)
500.0000 mL | INTRAVENOUS | Status: AC
  Administered 2016-04-27: 500 mL via INTRAVENOUS

## 2016-04-27 MED ORDER — ONDANSETRON HCL 4 MG PO TABS: 4.0000 mg | ORAL_TABLET | Freq: Four times a day (QID) | ORAL | Status: DC | PRN

## 2016-04-27 MED ORDER — KETOROLAC TROMETHAMINE 30 MG/ML IJ SOLN: 30.0000 mg | Freq: Four times a day (QID) | INTRAMUSCULAR | Status: AC | PRN

## 2016-04-27 MED ORDER — POTASSIUM CHLORIDE IN NACL 40-0.9 MEQ/L-% IV SOLN
INTRAVENOUS | Status: DC
  Administered 2016-04-27 – 2016-04-28 (×2): 100 mL/h via INTRAVENOUS
  Filled 2016-04-27 (×5): qty 1000

## 2016-04-27 MED ORDER — THIAMINE HCL 100 MG/ML IJ SOLN
100.0000 mg | Freq: Once | INTRAMUSCULAR | Status: AC
  Administered 2016-04-27: 100 mg via INTRAVENOUS
  Filled 2016-04-27: qty 2

## 2016-04-27 NOTE — H&P (Addendum)
[employer] - [place] [facility]   PATIENT NAME: Ryan Howe    MR#:  [id number]  DATE OF BIRTH:  18-Nov-1960  DATE OF ADMISSION:  04/27/2016  PRIMARY CARE PHYSICIAN: [facility]   REQUESTING/REFERRING PHYSICIAN: Loleta Rose, MD  CHIEF COMPLAINT:   Chief Complaint  Patient presents with  . Rectal Bleeding   Rectal bleeding and jaundice. HISTORY OF PRESENT ILLNESS:  Ryan Howe  is a 56 y.o. male with a known history of Chronic rectal bleeding, alcohol abuse, liver cirrhosis, liver failure, hypertension, hyperlipidemia and pancreatitis. The patient presented to the ED with rectal bleeding and jaundice. He has chronic rectal bleeding every other day but worsening for the past few days. He feels like his abdomen is distended but no abdominal pain. He has jaundice but worsening now. He also has dark urine. He denies any fever or chills, no nausea or vomiting but had diarrhea due to lactulose. He still drinks alcohol every day (at least 8 ounces of whiskey a day and probably 6 beers). He has tachycardia, tachypnea and leukocytosis. He is found to liver failure with bilirubin at 31.3.   PAST MEDICAL HISTORY:   Past Medical History:  Diagnosis Date  . Anemia of chronic disease   . Anxiety   . AVNRT (AV nodal re-entry tachycardia) (HCC)   . Cirrhosis (HCC)   . GERD (gastroesophageal reflux disease)   . Hypercholesteremia   . Hypertension   . Liver failure (HCC)   . Numbness of toes   . Pancreatitis   . Polysubstance abuse    a. former cocaine and tobacco abuse, ongoing alcohol and marijuana abuse  . Thrombocytopenia (HCC)     PAST SURGICAL HISTORY:   Past Surgical History:  Procedure Laterality Date  . COLONOSCOPY WITH PROPOFOL N/A 02/13/2016   Procedure: COLONOSCOPY WITH PROPOFOL;  Surgeon: Wyline Mood, MD;  Location: [facility] ENDOSCOPY;  Service: Endoscopy;  Laterality: N/A;  . ESOPHAGOGASTRODUODENOSCOPY (EGD) WITH PROPOFOL N/A  02/13/2016   Procedure: ESOPHAGOGASTRODUODENOSCOPY (EGD) WITH PROPOFOL;  Surgeon: Wyline Mood, MD;  Location: [facility] ENDOSCOPY;  Service: Endoscopy;  Laterality: N/A;  . NASAL RECONSTRUCTION WITH SEPTAL REPAIR      SOCIAL HISTORY:   Social History  Substance Use Topics  . Smoking status: Former Smoker    Quit date: 04/06/1999  . Smokeless tobacco: Never Used  . Alcohol use 4.8 oz/week    8 Cans of beer per week     Comment: pt reports quit 01/22/16, was drinking heavily    FAMILY HISTORY:   Family History  Problem Relation Age of Onset  . Pancreatitis Brother   . Heart failure Father     DRUG ALLERGIES:  No Known Allergies  REVIEW OF SYSTEMS:   Review of Systems  Constitutional: Positive for chills and malaise/fatigue. Negative for fever and weight loss.  HENT: Negative for congestion and nosebleeds.   Eyes: Negative for blurred vision and double vision.       Yellow eyes  Respiratory: Negative for cough, hemoptysis, shortness of breath and stridor.   Cardiovascular: Negative for chest pain, orthopnea and leg swelling.  Gastrointestinal: Positive for blood in stool, diarrhea and nausea. Negative for abdominal pain, constipation, melena and vomiting.  Genitourinary: Negative for dysuria, flank pain, frequency and hematuria.       Dark urine  Musculoskeletal: Negative for back pain.  Skin: Negative for itching and rash.       jaundice  Neurological: Positive for tremors. Negative for dizziness, focal  weakness, loss of consciousness and headaches.  Psychiatric/Behavioral: Negative for depression. The patient is not nervous/anxious.     MEDICATIONS AT HOME:   Prior to Admission medications   Medication Sig Start Date End Date Taking? Authorizing Provider  citalopram (CELEXA) 10 MG tablet Take 1 tablet (10 mg total) by mouth daily. 01/26/16   Antonieta Ibaimothy J Gollan, MD  feeding supplement, ENSURE ENLIVE, (ENSURE ENLIVE) LIQD Take 237 mLs by mouth 2 (two) times daily between meals.  12/26/15   Alford Highlandichard Wieting, MD  ferrous sulfate 325 (65 FE) MG tablet Take 1 tablet (325 mg total) by mouth 2 (two) times daily with a meal. 01/13/16   Milagros LollSrikar Sudini, MD  folic acid (FOLVITE) 1 MG tablet Take 1 tablet (1 mg total) by mouth daily. 12/27/15   Alford Highlandichard Wieting, MD  furosemide (LASIX) 40 MG tablet Take 1 tablet (40 mg total) by mouth every other day. 02/23/16   Antonieta Ibaimothy J Gollan, MD  lactulose (CHRONULAC) 10 GM/15ML solution Take 45 mLs (30 g total) by mouth 2 (two) times daily. 12/26/15   Alford Highlandichard Wieting, MD  MELATONIN PO Take 18 mg by mouth.    Historical Provider, MD  metoprolol tartrate (LOPRESSOR) 25 MG tablet Take 1 tablet (25 mg total) by mouth 2 (two) times daily. 12/26/15   Alford Highlandichard Wieting, MD  Multiple Vitamin (MULTIVITAMIN WITH MINERALS) TABS tablet Take 1 tablet by mouth daily. 12/27/15   Alford Highlandichard Wieting, MD  pantoprazole (PROTONIX) 40 MG tablet Take 1 tablet (40 mg total) by mouth daily. 01/13/16   Srikar Sudini, MD  polyethylene glycol (GOLYTELY) 236 g solution Drink one 8 oz glass every 20 mins until stools are clear. 01/21/16   Midge Miniumarren Wohl, MD  potassium chloride 20 MEQ TBCR Take 20 mEq by mouth daily. 01/13/16   Milagros LollSrikar Sudini, MD  prednisoLONE (ORAPRED) 15 MG/5ML solution 40mg  (13.463ml)po daily for 25 days, then 30mg  (10ml) po daily for two days; then 20 (5.473ml)mg daily for two days; then 10 mg (3.543ml)po daily for two days then 5 mg (1.576ml)po daily for two days Patient not taking: Reported on 02/02/2016 12/26/15   Alford Highlandichard Wieting, MD  spironolactone (ALDACTONE) 50 MG tablet Take 1 tablet (50 mg total) by mouth daily. 01/13/16   Srikar Sudini, MD      VITAL SIGNS:  Blood pressure (!) 143/78, pulse (!) 109, temperature 98.7 F (37.1 C), temperature source Oral, resp. rate (!) 26, height 6' (1.829 m), weight 192 lb (87.1 kg), SpO2 94 %.  PHYSICAL EXAMINATION:  Physical Exam  GENERAL:  56 y.o.-year-old patient lying in the bed with no acute distress. Obese. EYES: Pupils  equal, round, reactive to light and accommodation. Positive scleral icterus. Extraocular muscles intact.  HEENT: Head atraumatic, normocephalic. Oropharynx and nasopharynx clear.  NECK:  Supple, no jugular venous distention. No thyroid enlargement, no tenderness.  LUNGS: Normal breath sounds bilaterally, no wheezing, rales,rhonchi or crepitation. No use of accessory muscles of respiration.  CARDIOVASCULAR: S1, S2 normal. No murmurs, rubs, or gallops.  ABDOMEN: Soft, nontender, distended. Bowel sounds present. Positive ascites signs.  EXTREMITIES: No pedal edema, cyanosis, or clubbing.  NEUROLOGIC: Cranial nerves II through XII are intact. Muscle strength 5/5 in all extremities. Sensation intact. Gait not checked.  PSYCHIATRIC: The patient is alert and oriented x 3.  SKIN: No obvious rash, lesion, or ulcer. Severe jaundice.  LABORATORY PANEL:   CBC  Recent Labs Lab 04/27/16 1321  WBC 16.5*  HGB 9.6*  HCT 27.3*  PLT 135*   ------------------------------------------------------------------------------------------------------------------  Chemistries  Recent Labs Lab 04/27/16 1321  NA 137  K 3.0*  CL 104  CO2 19*  GLUCOSE 134*  BUN 6  CREATININE ICTERUS AT THIS LEVEL MAY AFFECT RESULT  CALCIUM 8.0*  AST 247*  ALT 43  ALKPHOS 227*  BILITOT 31.3*   ------------------------------------------------------------------------------------------------------------------  Cardiac Enzymes No results for input(s): TROPONINI in the last 168 hours. ------------------------------------------------------------------------------------------------------------------  RADIOLOGY:  No results found.    IMPRESSION AND PLAN:   Acute liver failure due to acute alcoholic hepatitis. Follow-upHepatitis panel, liver function test and Dr. Tobi Bastos. Start steroid tomorrow if renal function is okay and infection is ruled out. Continue lactulose.  Rectal bleeding. Chronic and worsening. Follow-up  hemoglobin and Dr. Tobi Bastos for possible colonoscopy.   Anemia of chronic disease and possible acute blood loss. Follow-up hemoglobin.   Sepsis, unclear etiology. The patient has tachycardia, tachypnea and leukocytosis with liver failure. Follow-up ultrasound of abdomen and the possible paracentesis to rule out SBP. Start vancomycin and Zosyn pharmacy to dose. IV fluid support and Follow-up CBC and cultures.  Lactic acidosis. Follow-up lactic acid level. Ascites due to liver cirrhosis. As mentioned above. Alcohol abuse. Start CIWAprotocol.  Hypokalemia. Give potassium supplement and follow-up BMP. Check magnesium level.  Discussed with Dr. Tobi Bastos. All the records are reviewed and case discussed with ED provider. Management plans discussed with the patient, his mother and they are in agreement.  CODE STATUS: Full code  TOTAL TIME TAKING CARE OF THIS PATIENT: 62 minutes.    Shaune Pollack M.D on 04/27/2016 at 4:31 PM  Between 7am to 6pm - Pager - [phone]  After 6pm go to [email or web address] - Scientist, research (life sciences) [facility]  Office  [phone]  CC: Primary care physician; [facility]   Note: This dictation was prepared with Dragon dictation along with smaller phrase technology. Any transcriptional errors that result from this process are unintentional.

## 2016-04-27 NOTE — ED Notes (Signed)
Report off to kasey rn  

## 2016-04-27 NOTE — ED Provider Notes (Signed)
[facility] Emergency Department Provider Note  ____________________________________________   First MD Initiated Contact with Patient 04/27/16 1412     (approximate)  I have reviewed the triage vital signs and the nursing notes.   HISTORY  Chief Complaint Rectal Bleeding    HPI Ryan Howe is a 56 y.o. male with a history of polysubstance abuse including continued daily alcohol use and liver failure and chronic GI bleeding who presents for evaluation of GI bleeding and generalized weakness.  He states that he has rectal bleeding "every other day".  He feels like his abdomen is distended and is not currently having any pain.  His skin and eyes are yellow and he was going to see "a liver doctor" about a week ago but the winter weather Him from being able to do so.  He reports that his symptoms of been gradual in onset and are currently severe.  He denies nausea and vomiting, fever/chills, chest pain, shortness of breath.  He states that he just feels bad in general and nothing particular makes it better or worse.  He reports that he does not do illegal drugs anymore but that he drinks at least 8 ounces of whiskey a day and probably 6 beers.He has seen Dr. Tobi Bastos the gastroenterologist in the past and believes Dr. Tobi Bastos is the GI doctor whom he was going to see last week.   Past Medical History:  Diagnosis Date  . Anemia of chronic disease   . Anxiety   . AVNRT (AV nodal re-entry tachycardia) (HCC)   . Cirrhosis (HCC)   . GERD (gastroesophageal reflux disease)   . Hypercholesteremia   . Hypertension   . Liver failure (HCC)   . Numbness of toes   . Pancreatitis   . Polysubstance abuse    a. former cocaine and tobacco abuse, ongoing alcohol and marijuana abuse  . Thrombocytopenia [facility])     Patient Active Problem List   Diagnosis Date Noted  . Blood in stool   . Dyspnea   . Generalized edema   . Alcohol abuse 01/12/2016  . Anemia of chronic  disease 01/12/2016  . Hypokalemia 01/12/2016  . Thrombocytopenia (HCC) 01/12/2016  . Elevated troponin 01/12/2016  . AVNRT (AV nodal re-entry tachycardia) (HCC) 01/12/2016  . Acute CHF (congestive heart failure) (HCC) 01/12/2016  . Cirrhosis of liver with ascites (HCC)   . HTN (hypertension) 01/11/2016  . HLD (hyperlipidemia) 01/11/2016  . Anxiety 01/11/2016  . Liver failure (HCC) 12/22/2015  . Bright red blood per rectum 12/02/2015    Past Surgical History:  Procedure Laterality Date  . COLONOSCOPY WITH PROPOFOL N/A 02/13/2016   Procedure: COLONOSCOPY WITH PROPOFOL;  Surgeon: Wyline Mood, MD;  Location: [facility] ENDOSCOPY;  Service: Endoscopy;  Laterality: N/A;  . ESOPHAGOGASTRODUODENOSCOPY (EGD) WITH PROPOFOL N/A 02/13/2016   Procedure: ESOPHAGOGASTRODUODENOSCOPY (EGD) WITH PROPOFOL;  Surgeon: Wyline Mood, MD;  Location: [facility] ENDOSCOPY;  Service: Endoscopy;  Laterality: N/A;  . NASAL RECONSTRUCTION WITH SEPTAL REPAIR      Prior to Admission medications   Medication Sig Start Date End Date Taking? Authorizing Provider  citalopram (CELEXA) 10 MG tablet Take 1 tablet (10 mg total) by mouth daily. 01/26/16  Yes Antonieta Iba, MD  feeding supplement, ENSURE ENLIVE, (ENSURE ENLIVE) LIQD Take 237 mLs by mouth 2 (two) times daily between meals. 12/26/15  Yes Alford Highland, MD  ferrous sulfate 325 (65 FE) MG tablet Take 1 tablet (325 mg total) by mouth 2 (two) times daily with a  meal. 01/13/16  Yes Srikar Sudini, MD  folic acid (FOLVITE) 1 MG tablet Take 1 tablet (1 mg total) by mouth daily. 12/27/15  Yes Alford Highland, MD  furosemide (LASIX) 40 MG tablet Take 1 tablet (40 mg total) by mouth every other day. 02/23/16  Yes Antonieta Iba, MD  lactulose (CHRONULAC) 10 GM/15ML solution Take 45 mLs (30 g total) by mouth 2 (two) times daily. 12/26/15  Yes Alford Highland, MD  MELATONIN PO    Yes Historical Provider, MD  metoprolol tartrate (LOPRESSOR) 25 MG tablet Take 1 tablet (25 mg total) by  mouth 2 (two) times daily. 12/26/15  Yes Alford Highland, MD  Multiple Vitamin (MULTIVITAMIN WITH MINERALS) TABS tablet Take 1 tablet by mouth daily. 12/27/15  Yes Richard Renae Gloss, MD  pantoprazole (PROTONIX) 40 MG tablet Take 1 tablet (40 mg total) by mouth daily. 01/13/16  Yes Srikar Sudini, MD  potassium chloride 20 MEQ TBCR Take 20 mEq by mouth daily. 01/13/16  Yes Srikar Sudini, MD  spironolactone (ALDACTONE) 50 MG tablet Take 1 tablet (50 mg total) by mouth daily. 01/13/16  Yes Srikar Sudini, MD  polyethylene glycol (GOLYTELY) 236 g solution Drink one 8 oz glass every 20 mins until stools are clear. Patient not taking: Reported on 04/27/2016 01/21/16   Midge Minium, MD  prednisoLONE (ORAPRED) 15 MG/5ML solution 40mg  (13.46ml)po daily for 25 days, then 30mg  (10ml) po daily for two days; then 20 (5.30ml)mg daily for two days; then 10 mg (3.42ml)po daily for two days then 5 mg (1.89ml)po daily for two days Patient not taking: Reported on 02/02/2016 12/26/15   Alford Highland, MD    Allergies Patient has no known allergies.  Family History  Problem Relation Age of Onset  . Pancreatitis Brother   . Heart failure Father     Social History Social History  Substance Use Topics  . Smoking status: Former Smoker    Quit date: 04/06/1999  . Smokeless tobacco: Never Used  . Alcohol use 4.8 oz/week    8 Cans of beer per week     Comment: pt reports quit 01/22/16, was drinking heavily    Review of Systems Constitutional: No fever/chills.  Generalized weakness/malaise/fatigue. Eyes: No visual changes.  Yellow eyes. ENT: No sore throat. Cardiovascular: Denies chest pain. Respiratory: Denies shortness of breath. Gastrointestinal: Rectal bleeding intermittently for one month.  No abdominal pain.  +Distention.  No nausea, no vomiting.  No diarrhea.  No constipation.  Genitourinary: Negative for dysuria. Musculoskeletal: Negative for back pain. Skin: Negative for rash. Neurological: Negative for  headaches, focal weakness or numbness.  10-point ROS otherwise negative.  ____________________________________________   PHYSICAL EXAM:  VITAL SIGNS: ED Triage Vitals  Enc Vitals Group     BP 04/27/16 1314 100/87     Pulse Rate 04/27/16 1314 (!) 107     Resp 04/27/16 1314 16     Temp 04/27/16 1314 98.7 F (37.1 C)     Temp Source 04/27/16 1314 Oral     SpO2 04/27/16 1314 94 %     Weight 04/27/16 1315 192 lb (87.1 kg)     Height 04/27/16 1315 6' (1.829 m)     Head Circumference --      Peak Flow --      Pain Score --      Pain Loc --      Pain Edu? --      Excl. in GC? --     Constitutional: Alert and oriented. Appears chronically ill but  is in no acute distress Eyes: Profound scleral icterus. PERRL. EOMI. Head: Atraumatic. Nose: No congestion/rhinnorhea. Mouth/Throat: Mucous membranes are moist.   Neck: No stridor.  No meningeal signs.   Cardiovascular: Borderline tachycardia, regular rhythm. Good peripheral circulation. Grossly normal heart sounds. Respiratory: Normal respiratory effort.  No retractions. Lungs CTAB. Gastrointestinal: Soft and nontender.  Mild distention. Rectal:  Light colored stool, no gross blood, hemoccult negative, quality control passed Musculoskeletal: No lower extremity tenderness nor edema. No gross deformities of extremities. Neurologic:  Normal speech and language. No gross focal neurologic deficits are appreciated.  No asterixis.  No tremor. Skin:  Skin is warm, dry and intact. No rash noted. Psychiatric: Mood and affect are normal. Speech and behavior are normal.  ____________________________________________   LABS (all labs ordered are listed, but only abnormal results are displayed)  Labs Reviewed  COMPREHENSIVE METABOLIC PANEL - Abnormal; Notable for the following:       Result Value   Potassium 3.0 (*)    CO2 19 (*)    Glucose, Bld 134 (*)    Calcium 8.0 (*)    Albumin 2.9 (*)    AST 247 (*)    Alkaline Phosphatase 227 (*)      Total Bilirubin 31.3 (*)    All other components within normal limits  CBC - Abnormal; Notable for the following:    WBC 16.5 (*)    RBC 2.89 (*)    Hemoglobin 9.6 (*)    HCT 27.3 (*)    RDW 22.0 (*)    Platelets 135 (*)    All other components within normal limits  PROTIME-INR - Abnormal; Notable for the following:    Prothrombin Time 20.2 (*)    All other components within normal limits  APTT - Abnormal; Notable for the following:    aPTT 63 (*)    All other components within normal limits  LIPASE, BLOOD - Abnormal; Notable for the following:    Lipase 114 (*)    All other components within normal limits  AMMONIA - Abnormal; Notable for the following:    Ammonia 72 (*)    All other components within normal limits  LACTIC ACID, PLASMA - Abnormal; Notable for the following:    Lactic Acid, Venous 2.5 (*)    All other components within normal limits  ETHANOL - Abnormal; Notable for the following:    Alcohol, Ethyl (B) 332 (*)    All other components within normal limits  LACTIC ACID, PLASMA  URINALYSIS, ROUTINE W REFLEX MICROSCOPIC  CREATININE, SERUM  HEPATITIS B SURFACE ANTIGEN  HEPATITIS C VRS RNA DETECT BY PCR-QUAL  HEPATITIS B DNA, ULTRAQUANTITATIVE, PCR  HEPATITIS C ANTIBODY  ACETAMINOPHEN LEVEL  URINE DRUG SCREEN, QUALITATIVE ([facility] ONLY)  CERULOPLASMIN  POC OCCULT BLOOD, ED  TYPE AND SCREEN   ____________________________________________  EKG  ED ECG REPORT I, Ruie Sendejo, the attending physician, personally viewed and interpreted this ECG.  Date: 04/27/2016 EKG Time: 14:25 Rate: 101 Rhythm: sinus tachycardia QRS Axis: normal Intervals: incomplete left bundle branch block ST/T Wave abnormalities: normal Conduction Disturbances: none Narrative Interpretation: unremarkable  ____________________________________________  RADIOLOGY   No results found.  ____________________________________________   PROCEDURES  Procedure(s) performed:    Procedures   Critical Care performed: No ____________________________________________   INITIAL IMPRESSION / ASSESSMENT AND PLAN / ED COURSE  Pertinent labs & imaging results that were available during my care of the patient were reviewed by me and considered in my medical decision making (see chart for details).  Patient has obvious liver failure with jaundice and scleral icterus.  I will proceed with standard evaluation.  He is hemodynamically stable at this time.  His borderline tachycardia may represent volume depletion or may represent mild call withdrawal.  I am giving him thiamine, folate, and putting him on CIWA precautions.  Also giving him a small fluid bolus.  His hemoglobin is not significantly decreased from prior.    (Note that documentation was delayed due to multiple ED patients requiring immediate care.)   Spoke by phone with Dr. Tobi BastosAnna (GI) regarding highly abnormal labs.  Dr. Tobi BastosAnna feels that we can care for the patient at this hospital given that he is not a candidate for liver transplant due to the ongoing alcohol abuse.  He agreed with the plan for lactulose at this time and will come down to the emergency department to see the patient today.  I have admitted the patient to the hospitalist for further management.  ____________________________________________  FINAL CLINICAL IMPRESSION(S) / ED DIAGNOSES  Final diagnoses:  Acute liver failure without hepatic coma  Hyperbilirubinemia  Elevated lactic acid level  Hyperammonemia (HCC)     MEDICATIONS GIVEN DURING THIS VISIT:  Medications  folic acid injection 1 mg (1 mg Intravenous Given 04/27/16 1442)  LORazepam (ATIVAN) tablet 0-4 mg (2 mg Oral Given 04/27/16 1758)    Followed by  LORazepam (ATIVAN) tablet 0-4 mg (not administered)  thiamine (B-1) injection 100 mg (not administered)  phytonadione (VITAMIN K) SQ injection 10 mg (10 mg Subcutaneous Given 04/27/16 1734)  sodium chloride 0.9 % bolus 500 mL (0  mLs Intravenous Stopped 04/27/16 1503)  thiamine (B-1) injection 100 mg (100 mg Intravenous Given 04/27/16 1437)  sodium chloride 0.9 % bolus 1,000 mL (0 mLs Intravenous Stopped 04/27/16 1734)  lactulose (CHRONULAC) 10 GM/15ML solution 30 g (30 g Oral Given 04/27/16 1550)     NEW OUTPATIENT MEDICATIONS STARTED DURING THIS VISIT:  Current Discharge Medication List      Current Discharge Medication List      Current Discharge Medication List       Note:  This document was prepared using Dragon voice recognition software and may include unintentional dictation errors.    Loleta Roseory Feliberto Stockley, MD 04/27/16 2114

## 2016-04-27 NOTE — ED Notes (Signed)
Iv fluids infusing.  meds given   Family with pt.  Pt alert.  siderails up x 2.

## 2016-04-27 NOTE — ED Triage Notes (Signed)
Pt is slow to respond, however, responds appropriately.

## 2016-04-27 NOTE — ED Notes (Signed)
Pt had large watery stool in bed.  Pt ambulated with assistance to bathroom

## 2016-04-27 NOTE — ED Notes (Signed)
Lactic Acid redrawn and send to lab. This nurse also asked if he could urinate. Pt states he is not able to urinate at this time.

## 2016-04-27 NOTE — ED Triage Notes (Signed)
Pt here with c/o rectal bleeding off an on for a little over a month. Pt states he fills the toilet with blood, states he is constipated and has issues with hemorrhoids as well. Pt states he is week from the bleeding, sclera and skin appear jaundice. Pt states he is liver failure, first appointment with liver specialist cancelled last week due to snow.

## 2016-04-27 NOTE — ED Notes (Signed)
Resumed care from [employer] Electric[employer] rn.    Lab called with total bili of 31.3 and lactic acid of 2.5   Dr York Ceriseforbach aware.

## 2016-04-27 NOTE — Consult Note (Signed)
Wyline Mood MD  [address] [place], [place] [address] Phone: [phone] Fax : [phone]  Consultation Primary Care Physician:  [facility] Primary Gastroenterologist:  Dr. Servando Snare         Reason for Consultation:     Abnormal LFT's  Date of Admission:  04/27/2016 Date of Consultation:  04/27/2016         HPI:   Ryan Howe is a 56 y.o. male  , he has a history of alcohol abuse,ascites,cirrhosis of liver,  alcoholic hepatitis,SVT  . He has had ahistory of rectal bleeding and yet to have a colonoscopy . He was admitted in 01/2016 for alcoholic hepatitis and rectal bleeding. He has a history of polysubstance abuse. Ongoing alcohol abuse.  EGD 02/2016- portal hypertensive gastropathy with no varices Colonoscopy 02/2016- yellow stool- seen in sigmoid colon and hence procedure was discontinued. Procedure was rescheduled for 03/19/16 which he rescheduled and did not call back for an appointment.   He says he drinks atleast 6 beers each day with other spirits in addition , used to use illegal drugs in the past not presently. Has been using NSAID's recently, denies use of any herbal supplements, tyelenol, OTC meds, mushrooms. Denies any hematemesis. Notices blood on toilet paper when he wipes and some on the stool after a bowel movement for many weeks.    Past Medical History:  Diagnosis Date  . Anemia of chronic disease   . Anxiety   . AVNRT (AV nodal re-entry tachycardia) (HCC)   . Cirrhosis (HCC)   . GERD (gastroesophageal reflux disease)   . Hypercholesteremia   . Hypertension   . Liver failure (HCC)   . Numbness of toes   . Pancreatitis   . Polysubstance abuse    a. former cocaine and tobacco abuse, ongoing alcohol and marijuana abuse  . Thrombocytopenia (HCC)     Past Surgical History:  Procedure Laterality Date  . COLONOSCOPY WITH PROPOFOL N/A 02/13/2016   Procedure: COLONOSCOPY WITH PROPOFOL;  Surgeon: Wyline Mood, MD;  Location: [facility] ENDOSCOPY;   Service: Endoscopy;  Laterality: N/A;  . ESOPHAGOGASTRODUODENOSCOPY (EGD) WITH PROPOFOL N/A 02/13/2016   Procedure: ESOPHAGOGASTRODUODENOSCOPY (EGD) WITH PROPOFOL;  Surgeon: Wyline Mood, MD;  Location: [facility] ENDOSCOPY;  Service: Endoscopy;  Laterality: N/A;  . NASAL RECONSTRUCTION WITH SEPTAL REPAIR      Prior to Admission medications   Medication Sig Start Date End Date Taking? Authorizing Provider  citalopram (CELEXA) 10 MG tablet Take 1 tablet (10 mg total) by mouth daily. 01/26/16   Antonieta Iba, MD  feeding supplement, ENSURE ENLIVE, (ENSURE ENLIVE) LIQD Take 237 mLs by mouth 2 (two) times daily between meals. 12/26/15   Alford Highland, MD  ferrous sulfate 325 (65 FE) MG tablet Take 1 tablet (325 mg total) by mouth 2 (two) times daily with a meal. 01/13/16   Milagros Loll, MD  folic acid (FOLVITE) 1 MG tablet Take 1 tablet (1 mg total) by mouth daily. 12/27/15   Alford Highland, MD  furosemide (LASIX) 40 MG tablet Take 1 tablet (40 mg total) by mouth every other day. 02/23/16   Antonieta Iba, MD  lactulose (CHRONULAC) 10 GM/15ML solution Take 45 mLs (30 g total) by mouth 2 (two) times daily. 12/26/15   Alford Highland, MD  MELATONIN PO Take 18 mg by mouth.    Historical Provider, MD  metoprolol tartrate (LOPRESSOR) 25 MG tablet Take 1 tablet (25 mg total) by mouth 2 (two) times daily. 12/26/15   Alford Highland, MD  Multiple Vitamin (MULTIVITAMIN WITH MINERALS) TABS tablet Take 1 tablet by mouth daily. 12/27/15   Alford Highlandichard Wieting, MD  pantoprazole (PROTONIX) 40 MG tablet Take 1 tablet (40 mg total) by mouth daily. 01/13/16   Srikar Sudini, MD  polyethylene glycol (GOLYTELY) 236 g solution Drink one 8 oz glass every 20 mins until stools are clear. 01/21/16   Midge Miniumarren Wohl, MD  potassium chloride 20 MEQ TBCR Take 20 mEq by mouth daily. 01/13/16   Milagros LollSrikar Sudini, MD  prednisoLONE (ORAPRED) 15 MG/5ML solution 40mg  (13.653ml)po daily for 25 days, then 30mg  (10ml) po daily for two days; then 20  (5.323ml)mg daily for two days; then 10 mg (3.113ml)po daily for two days then 5 mg (1.596ml)po daily for two days Patient not taking: Reported on 02/02/2016 12/26/15   Alford Highlandichard Wieting, MD  spironolactone (ALDACTONE) 50 MG tablet Take 1 tablet (50 mg total) by mouth daily. 01/13/16   Milagros LollSrikar Sudini, MD    Family History  Problem Relation Age of Onset  . Pancreatitis Brother   . Heart failure Father      Social History  Substance Use Topics  . Smoking status: Former Smoker    Quit date: 04/06/1999  . Smokeless tobacco: Never Used  . Alcohol use 4.8 oz/week    8 Cans of beer per week     Comment: pt reports quit 01/22/16, was drinking heavily    Allergies as of 04/27/2016  . (No Known Allergies)    Review of Systems:    All systems reviewed and negative except where noted in HPI.   Physical Exam:  Vital signs in last 24 hours: Temp:  [98.7 F (37.1 C)] 98.7 F (37.1 C) (01/23 1314) Pulse Rate:  [101-108] 108 (01/23 1500) Resp:  [16-27] 26 (01/23 1500) BP: (100-147)/(75-87) 147/77 (01/23 1500) SpO2:  [94 %-96 %] 95 % (01/23 1500) Weight:  [192 lb (87.1 kg)] 192 lb (87.1 kg) (01/23 1315)   General:   Pleasant, cooperative in NAD Head:  Normocephalic and atraumatic. Eyes:   No icterus.   Conjunctiva pink. PERRLA. Ears:  Normal auditory acuity. Neck:  Supple; no masses or thyroidomegaly Lungs: Respirations even and unlabored. Lungs clear to auscultation bilaterally.   No wheezes, crackles, or rhonchi.  Heart:  Regular rate and rhythm;  Without murmur, clicks, rubs or gallops Abdomen:  Soft, distended, nontender, ascites +. Normal bowel sounds. No appreciable masses or hepatomegaly.  No rebound or guarding.  Rectal:  Not performed. Extremities:  Without edema, cyanosis or clubbing. Neurologic:  Alert and oriented x3;  grossly normal neurologically. Skin: deep yellow Psych:  Alert and cooperative. Normal affect.  LAB RESULTS:  Recent Labs  04/27/16 1321  WBC 16.5*  HGB 9.6*    HCT 27.3*  PLT 135*   CBC Latest Ref Rng & Units 04/27/2016 01/26/2016 01/21/2016  WBC 3.8 - 10.6 K/uL 16.5(H) 14.6(H) 9.6  Hemoglobin 13.0 - 18.0 g/dL 1.6(X9.6(L) - 10.3(L)  Hematocrit 40.0 - 52.0 % 27.3(L) 32.9(L) 31.3(L)  Platelets 150 - 440 K/uL 135(L) 327 196    BMET  Recent Labs  04/27/16 1321  NA 137  K 3.0*  CL 104  CO2 19*  GLUCOSE 134*  BUN 6  CREATININE ICTERUS AT THIS LEVEL MAY AFFECT RESULT  CALCIUM 8.0*   LFT  Recent Labs  04/27/16 1321  PROT 8.0  ALBUMIN 2.9*  AST 247*  ALT 43  ALKPHOS 227*  BILITOT 31.3*   PT/INR No results for input(s): LABPROT, INR in the last 72 hours.  STUDIES: No results found.    Impression / Plan:   Ryan Howe is a 56 y.o. y/o male with  H/o of long standing alcohol abuse which is ongoing admitted with jaundice, rectal bleeding which is long standing    1. Rectal bleeding : long standing- Hb at baseline. Needs a colonoscopy when stable. Monitor when in hospital   2. Abnormal LFT's- likely severe alcoholic hepatitis.Check creatinine, if has renal failure too then will plan to commence on Trental. If not then will follow LFT's and INR closely to decide if he is a candidate for steroids tomorrow , will need urine analysis , CXR prior to commencing on steroids to ensure he has no active infection.   3. Likely acute liver failure: R/o portal vein thrombosis with doppler Tylenol levels Acute hepatitis panel  Lactulose titrated to 2 soft bowel movements daily , avoid diarrhea IV thiamine  Watch for alcohol withdrawal He is not a candidate for liver transplant due to ongoing alcohol abuse I have explained to him that the mortality rate from alcoholic hepatitis is 25-50% at one month He needs to absolutely stop drinking alcohol.  Watch for hypoglycemia If INR is elevated administer vitamin K 10 mg S.c Once daily for 3 days IV fluids to treat acidosis/hypovolemia , can also use albumin.  Diagnostic paracentesis to r/o  SBP Absolutely no NSAID's  Plan discussed with Dr Imogene Burn who is admitting the patient.   Thank you for involving me in the care of this patient.      LOS: 0 days   Wyline Mood, MD  04/27/2016, 3:28 PM

## 2016-04-28 ENCOUNTER — Inpatient Hospital Stay: Payer: BLUE CROSS/BLUE SHIELD

## 2016-04-28 DIAGNOSIS — K7290 Hepatic failure, unspecified without coma: Secondary | ICD-10-CM

## 2016-04-28 DIAGNOSIS — J9601 Acute respiratory failure with hypoxia: Secondary | ICD-10-CM

## 2016-04-28 DIAGNOSIS — K7200 Acute and subacute hepatic failure without coma: Secondary | ICD-10-CM

## 2016-04-28 LAB — CBC
HCT: 23.1 % — ABNORMAL LOW (ref 40.0–52.0)
Hemoglobin: 8.1 g/dL — ABNORMAL LOW (ref 13.0–18.0)
MCH: 33.5 pg (ref 26.0–34.0)
MCHC: 35 g/dL (ref 32.0–36.0)
MCV: 95.7 fL (ref 80.0–100.0)
PLATELETS: 109 10*3/uL — AB (ref 150–440)
RBC: 2.41 MIL/uL — ABNORMAL LOW (ref 4.40–5.90)
RDW: 22.6 % — AB (ref 11.5–14.5)
WBC: 10.1 10*3/uL (ref 3.8–10.6)

## 2016-04-28 LAB — URINALYSIS, ROUTINE W REFLEX MICROSCOPIC
Glucose, UA: 50 mg/dL — AB
Ketones, ur: NEGATIVE mg/dL
Nitrite: NEGATIVE
PH: 5 (ref 5.0–8.0)
Protein, ur: 100 mg/dL — AB
SPECIFIC GRAVITY, URINE: 1.033 — AB (ref 1.005–1.030)
Squamous Epithelial / HPF: NONE SEEN

## 2016-04-28 LAB — COMPREHENSIVE METABOLIC PANEL
ALBUMIN: 2.3 g/dL — AB (ref 3.5–5.0)
ALT: 34 U/L (ref 17–63)
ANION GAP: 10 (ref 5–15)
AST: 205 U/L — ABNORMAL HIGH (ref 15–41)
Alkaline Phosphatase: 194 U/L — ABNORMAL HIGH (ref 38–126)
BUN: 7 mg/dL (ref 6–20)
CHLORIDE: 111 mmol/L (ref 101–111)
CO2: 18 mmol/L — AB (ref 22–32)
Calcium: 7.3 mg/dL — ABNORMAL LOW (ref 8.9–10.3)
GLUCOSE: 108 mg/dL — AB (ref 65–99)
POTASSIUM: 3.3 mmol/L — AB (ref 3.5–5.1)
SODIUM: 139 mmol/L (ref 135–145)
TOTAL PROTEIN: 6.5 g/dL (ref 6.5–8.1)
Total Bilirubin: 25.2 mg/dL (ref 0.3–1.2)

## 2016-04-28 LAB — BASIC METABOLIC PANEL
ANION GAP: 10 (ref 5–15)
BUN: 12 mg/dL (ref 6–20)
CALCIUM: 7 mg/dL — AB (ref 8.9–10.3)
CO2: 20 mmol/L — ABNORMAL LOW (ref 22–32)
Chloride: 113 mmol/L — ABNORMAL HIGH (ref 101–111)
Creatinine, Ser: UNDETERMINED mg/dL (ref 0.61–1.24)
Glucose, Bld: 174 mg/dL — ABNORMAL HIGH (ref 65–99)
POTASSIUM: 4.2 mmol/L (ref 3.5–5.1)
SODIUM: 143 mmol/L (ref 135–145)

## 2016-04-28 LAB — URINE DRUG SCREEN, QUALITATIVE (ARMC ONLY)
Amphetamines, Ur Screen: NOT DETECTED
BARBITURATES, UR SCREEN: NOT DETECTED
BENZODIAZEPINE, UR SCRN: POSITIVE — AB
CANNABINOID 50 NG, UR ~~LOC~~: NOT DETECTED
Cocaine Metabolite,Ur ~~LOC~~: NOT DETECTED
MDMA (Ecstasy)Ur Screen: NOT DETECTED
METHADONE SCREEN, URINE: NOT DETECTED
Opiate, Ur Screen: NOT DETECTED
Phencyclidine (PCP) Ur S: NOT DETECTED
TRICYCLIC, UR SCREEN: NOT DETECTED

## 2016-04-28 LAB — BLOOD GAS, ARTERIAL
Acid-base deficit: 11.7 mmol/L — ABNORMAL HIGH (ref 0.0–2.0)
BICARBONATE: 16 mmol/L — AB (ref 20.0–28.0)
FIO2: 0.8
LHR: 15 {breaths}/min
MECHVT: 500 mL
O2 SAT: 96.7 %
PATIENT TEMPERATURE: 37
PCO2 ART: 42 mmHg (ref 32.0–48.0)
PEEP/CPAP: 5 cmH2O
PO2 ART: 107 mmHg (ref 83.0–108.0)
pH, Arterial: 7.19 — CL (ref 7.350–7.450)

## 2016-04-28 LAB — MRSA PCR SCREENING: MRSA by PCR: NEGATIVE

## 2016-04-28 LAB — AMMONIA: Ammonia: 86 umol/L — ABNORMAL HIGH (ref 9–35)

## 2016-04-28 LAB — PROTIME-INR
INR: 1.59
PROTHROMBIN TIME: 19.1 s — AB (ref 11.4–15.2)

## 2016-04-28 LAB — GLUCOSE, CAPILLARY: Glucose-Capillary: 70 mg/dL (ref 65–99)

## 2016-04-28 MED ORDER — MIDAZOLAM HCL 2 MG/2ML IJ SOLN
4.0000 mg | INTRAMUSCULAR | Status: AC
  Administered 2016-04-28: 4 mg via INTRAVENOUS

## 2016-04-28 MED ORDER — FENTANYL CITRATE (PF) 100 MCG/2ML IJ SOLN
200.0000 ug | INTRAMUSCULAR | Status: AC
  Administered 2016-04-28: 200 ug via INTRAVENOUS

## 2016-04-28 MED ORDER — PENTOXIFYLLINE ER 400 MG PO TBCR
400.0000 mg | EXTENDED_RELEASE_TABLET | Freq: Every day | ORAL | Status: DC
  Administered 2016-04-29 – 2016-04-30 (×2): 400 mg via ORAL
  Filled 2016-04-28 (×3): qty 1

## 2016-04-28 MED ORDER — LACTULOSE ENEMA
300.0000 mL | Freq: Three times a day (TID) | ORAL | Status: DC
  Administered 2016-04-28: 300 mL via RECTAL
  Filled 2016-04-28 (×3): qty 300

## 2016-04-28 MED ORDER — SENNOSIDES 8.8 MG/5ML PO SYRP
5.0000 mL | ORAL_SOLUTION | Freq: Two times a day (BID) | ORAL | Status: DC | PRN
  Administered 2016-05-03: 5 mL
  Filled 2016-04-28 (×2): qty 5

## 2016-04-28 MED ORDER — ADULT MULTIVITAMIN LIQUID CH
15.0000 mL | Freq: Every day | ORAL | Status: DC
  Administered 2016-04-28 – 2016-04-29 (×2): 15 mL via ORAL
  Filled 2016-04-28 (×3): qty 15

## 2016-04-28 MED ORDER — FENTANYL CITRATE (PF) 100 MCG/2ML IJ SOLN
50.0000 ug | Freq: Once | INTRAMUSCULAR | Status: AC
  Administered 2016-04-28: 50 ug via INTRAVENOUS

## 2016-04-28 MED ORDER — VANCOMYCIN HCL 10 G IV SOLR
1250.0000 mg | Freq: Two times a day (BID) | INTRAVENOUS | Status: DC
  Filled 2016-04-28 (×2): qty 1250

## 2016-04-28 MED ORDER — SODIUM CHLORIDE 0.9 % IV SOLN
30.0000 meq | Freq: Once | INTRAVENOUS | Status: AC
  Administered 2016-04-28: 30 meq via INTRAVENOUS
  Filled 2016-04-28: qty 15

## 2016-04-28 MED ORDER — VANCOMYCIN HCL 10 G IV SOLR: 1250.0000 mg | INTRAVENOUS | Status: DC

## 2016-04-28 MED ORDER — MIDAZOLAM HCL 2 MG/2ML IJ SOLN: 2.0000 mg | INTRAMUSCULAR | Status: DC | PRN

## 2016-04-28 MED ORDER — PANTOPRAZOLE SODIUM 40 MG IV SOLR
40.0000 mg | INTRAVENOUS | Status: DC
  Administered 2016-04-28 – 2016-04-29 (×2): 40 mg via INTRAVENOUS
  Filled 2016-04-28 (×2): qty 40

## 2016-04-28 MED ORDER — VANCOMYCIN HCL 10 G IV SOLR
1250.0000 mg | Freq: Two times a day (BID) | INTRAVENOUS | Status: DC
  Administered 2016-04-28: 1250 mg via INTRAVENOUS
  Filled 2016-04-28 (×2): qty 1250

## 2016-04-28 MED ORDER — SODIUM BICARBONATE 8.4 % IV SOLN
50.0000 meq | Freq: Once | INTRAVENOUS | Status: AC
  Administered 2016-04-28: 50 meq via INTRAVENOUS

## 2016-04-28 MED ORDER — SODIUM BICARBONATE 8.4 % IV SOLN
INTRAVENOUS | Status: AC
  Filled 2016-04-28: qty 150

## 2016-04-28 MED ORDER — FENTANYL CITRATE (PF) 100 MCG/2ML IJ SOLN
INTRAMUSCULAR | Status: AC
  Filled 2016-04-28: qty 4

## 2016-04-28 MED ORDER — SODIUM BICARBONATE 8.4 % IV SOLN
100.0000 meq | Freq: Once | INTRAVENOUS | Status: AC
  Administered 2016-04-28: 100 meq via INTRAVENOUS

## 2016-04-28 MED ORDER — SODIUM BICARBONATE 8.4 % IV SOLN
INTRAVENOUS | Status: DC
  Administered 2016-04-28 – 2016-04-30 (×4): via INTRAVENOUS
  Filled 2016-04-28 (×7): qty 150

## 2016-04-28 MED ORDER — FENTANYL 2500MCG IN NS 250ML (10MCG/ML) PREMIX INFUSION
INTRAVENOUS | Status: AC
  Administered 2016-04-28: 14:00:00
  Filled 2016-04-28: qty 250

## 2016-04-28 MED ORDER — FENTANYL 2500MCG IN NS 250ML (10MCG/ML) PREMIX INFUSION
25.0000 ug/h | INTRAVENOUS | Status: DC
  Administered 2016-04-28 – 2016-04-29 (×2): 200 ug/h via INTRAVENOUS
  Administered 2016-04-29: 20 ug/h via INTRAVENOUS
  Administered 2016-04-30: 200 ug/h via INTRAVENOUS
  Filled 2016-04-28 (×3): qty 250

## 2016-04-28 MED ORDER — VECURONIUM BROMIDE 10 MG IV SOLR
INTRAVENOUS | Status: AC
  Filled 2016-04-28: qty 10

## 2016-04-28 MED ORDER — MIDAZOLAM HCL 2 MG/2ML IJ SOLN
INTRAMUSCULAR | Status: AC
  Filled 2016-04-28: qty 4

## 2016-04-28 MED ORDER — VECURONIUM BROMIDE 10 MG IV SOLR
10.0000 mg | INTRAVENOUS | Status: AC
  Administered 2016-04-28: 10 mg via INTRAVENOUS

## 2016-04-28 MED ORDER — FENTANYL 2500MCG IN NS 250ML (10MCG/ML) PREMIX INFUSION
100.0000 ug/h | INTRAVENOUS | Status: DC
  Administered 2016-04-28: 100 ug/h via INTRAVENOUS

## 2016-04-28 MED ORDER — FENTANYL BOLUS VIA INFUSION
50.0000 ug | INTRAVENOUS | Status: DC | PRN
  Filled 2016-04-28: qty 50

## 2016-04-28 MED ORDER — SODIUM CHLORIDE 0.9 % IV SOLN
1500.0000 mg | Freq: Two times a day (BID) | INTRAVENOUS | Status: DC
  Administered 2016-04-28: 1500 mg via INTRAVENOUS
  Filled 2016-04-28 (×3): qty 1500

## 2016-04-28 MED ORDER — PIPERACILLIN-TAZOBACTAM 3.375 G IVPB
3.3750 g | Freq: Three times a day (TID) | INTRAVENOUS | Status: DC
  Administered 2016-04-28 – 2016-04-30 (×7): 3.375 g via INTRAVENOUS
  Filled 2016-04-28 (×6): qty 50

## 2016-04-28 MED ORDER — ACETAMINOPHEN 325 MG RE SUPP
325.0000 mg | Freq: Four times a day (QID) | RECTAL | Status: DC | PRN
  Administered 2016-04-28 – 2016-04-29 (×2): 325 mg via RECTAL
  Filled 2016-04-28 (×2): qty 1

## 2016-04-28 NOTE — Progress Notes (Signed)
Notified Dr. Juliene PinaMody of decrease in BP and patient maintaining respirations in the 30s to 40s. Pt has a temp of 103.1 axillary on repeat following tylenol suppository. RN requesting new orders for the patient. MD vooiced that she will place orders to transfer to stepdown. Will continue to monitor.

## 2016-04-28 NOTE — Progress Notes (Signed)
Patient transferred over to ICU and report given to RN

## 2016-04-28 NOTE — Progress Notes (Signed)
Rectal tube placed for rectal lactulose adminstration. No evidence of impaction prior to placement. Return of small amount of yellow liquid stool in tubing. Pt tolerated placement without incidence.

## 2016-04-28 NOTE — Progress Notes (Signed)
Will take down 1,250mg  dose of vancomycin and will hang 1,500mg  dose per pharmacist

## 2016-04-28 NOTE — Progress Notes (Addendum)
Talked to NP regarding lactulose administration per rectal, Annabelle HarmanDana will assess pt's labs before administration per OG tube, also notified of pt already on NS KCL at 19800ml/hr and d5w with sodium bicarbonate 13100ml/hr, will review labs. Will await for new orders.

## 2016-04-28 NOTE — Progress Notes (Signed)
Patient with fever of 103.1 after being given Tylenol. Patient with increasing respiratory rate and increasing lethargy. Patient needs intubation. Case discussed with intensivist.

## 2016-04-28 NOTE — Progress Notes (Signed)
Pharmacy Antibiotic Note  Ryan PolioMichael J Howe is a 56 y.o. male admitted on 04/27/2016 with sepsis.  Pharmacy has been consulted for vancomycin and Zosyn dosing.  Plan: No recent serum creatinine -- [facility].7493[facility] 12/01/15.  Dose of 1g at 2330 1/23 was never given.  Will start vanc 1.5g q12h and will check a trough 1/25 @ 0800 prior to 3rd dose. Ke 0.085 T1/2 8 hours ([facility] Vd 79L (patient has ascites so larger Vd patient) Zosyn 3.375 grams q 8 hours ordered.  Height: 6' (182.9 cm) Weight: 193 lb 8 oz (87.8 kg) IBW/kg (Calculated) : 77.6  Temp (24hrs), Avg:98.5 F (36.9 C), Min:98.2 F (36.8 C), Max:98.7 F (37.1 C)   Last Labs    Recent Labs Lab 04/27/16 1321 04/27/16 1423 04/27/16 2047 04/27/16 2127 04/28/16 0527  WBC 16.5*  --   --   --  10.1  CREATININE ICTERUS AT THIS LEVEL MAY AFFECT RESULT  --   --  ICTERUS AT THIS LEVEL MAY AFFECT RESULT RESULTS UNAVAILABLE DUE TO INTERFERING SUBSTANCE  LATICACIDVEN  --  2.5* 1.8  --   --       CrCl cannot be calculated (This lab value cannot be used to calculate CrCl because it is not a number: RESULTS UNAVAILABLE DUE TO INTERFERING SUBSTANCE).    No Known Allergies  Antimicrobials this admission: vancomycin 1/24 >>  Zosyn 1/24 >>   Dose adjustments this admission:   Microbiology results: 1/23 BCx: pending  Thank you for allowing pharmacy to be a part of this patient's care.  Thomasene Rippleavid Seri Kimmer, PharmD, BCPS Clinical Pharmacist 04/28/2016

## 2016-04-28 NOTE — Procedures (Signed)
Endotracheal Intubation: Patient required placement of an artificial airway secondary to resp failure.   Consent: Emergent.   Hand washing performed prior to starting the procedure.   Medications administered for sedation prior to procedure: Midazolam 4 mg IV,  Vecuronium 10 mg IV, Fentanyl 100 mcg IV.   Procedure: A time out procedure was called and correct patient, name, & ID confirmed. Needed supplies and equipment were assembled and checked to include ETT, 10 ml syringe, Glidescope, Mac and Miller blades, suction, oxygen and bag mask valve, end tidal CO2 monitor. Patient was positioned to align the mouth and pharynx to facilitate visualization of the glottis.  Heart rate, SpO2 and blood pressure was continuously monitored during the procedure. Pre-oxygenation was conducted prior to intubation and endotracheal tube was placed through the vocal cords into the trachea.  During intubation an assistant applied gentle pressure to the cricoid cartilage.   The artificial airway was placed under direct visualization via glidescope route using a 8.0 ETT on the first attempt.    ETT was secured at 24 cm mark.    Placement was confirmed by auscuitation of lungs with good breath sounds bilaterally and no stomach sounds.  Condensation was noted on endotracheal tube.  Pulse ox 89%.  CO2 detector in place with appropriate color change.   Complications: None .   Operator: Kashana Breach.   Chest radiograph ordered and pending.   Comments: OGT placed via glidescope.  Corrin Parker, M.D.  [facility]  Medical Director [place] Director [facility] Cardio-Pulmonary Department

## 2016-04-28 NOTE — Progress Notes (Signed)
Noticiwed that patient did not receive his 1330 vancomycin dose as the roller clamp was not open. Per pharmacy will hang 8AM dose of 1,250mg .

## 2016-04-28 NOTE — H&P (Signed)
PULMONARY / CRITICAL CARE MEDICINE   Name: Ryan Howe MRN: [id number] DOB: 07-10-1960    ADMISSION DATE:  04/27/2016 CONSULTATION DATE:  04/28/16  REFERRING MD:  Dr. Benjie Karvonen  CHIEF COMPLAINT:  Respiratory Distress  HISTORY OF PRESENT ILLNESS:   Ryan Howe is a 56 y.o. Male with a known history of Chronic Rectal Bleeding, Alcohol Abuse, Cirrhosis, Liver Failure, Hypertension, Hyperlipidemia, and Pancreatitis.  He presented to [facility] ED on 04/27/16 with complaints of rectal bleeding that had worsened over the past few days from baseline, abdominal distention, and jaundice. He is a current every day drinker (at lease 8 ounces of whiskey per day and occasionally drinks 6 beers), last known drink was 04/27/16. In the ED he was found to be tachycardic, tachypneic with leukocytosis and an elevated bilirubin 31.3. He was subsequently admitted to [facility] unit for further treatment. On 04/28/16 pt noted to be in respiratory distress and lethargic he was subsequently transferred to ICU requiring mechanical intubation.  PCCM consulted 01/24 for management of acute respiratory failure and acute encephalopathy requiring mechanical intubation.     PAST MEDICAL HISTORY :  He  has a past medical history of Anemia of chronic disease; Anxiety; AVNRT (AV nodal re-entry tachycardia) ([place]); Cirrhosis ([place]); GERD (gastroesophageal reflux disease); Hypercholesteremia; Hypertension; Liver failure ([place]); Numbness of toes; Pancreatitis; Polysubstance abuse; and Thrombocytopenia ([place]).  PAST SURGICAL HISTORY: He  has a past surgical history that includes Nasal reconstruction with septal repair; Colonoscopy with propofol (N/A, 02/13/2016); and Esophagogastroduodenoscopy (egd) with propofol (N/A, 02/13/2016).  No Known Allergies  No current facility-administered medications on file prior to encounter.    Current Outpatient Prescriptions on File Prior to Encounter  Medication Sig  . citalopram (CELEXA) 10 MG tablet  Take 1 tablet (10 mg total) by mouth daily.  . feeding supplement, ENSURE ENLIVE, (ENSURE ENLIVE) LIQD Take 237 mLs by mouth 2 (two) times daily between meals.  . ferrous sulfate 325 (65 FE) MG tablet Take 1 tablet (325 mg total) by mouth 2 (two) times daily with a meal.  . folic acid (FOLVITE) 1 MG tablet Take 1 tablet (1 mg total) by mouth daily.  . furosemide (LASIX) 40 MG tablet Take 1 tablet (40 mg total) by mouth every other day.  . lactulose (CHRONULAC) 10 GM/15ML solution Take 45 mLs (30 g total) by mouth 2 (two) times daily.  Marland Kitchen MELATONIN PO   . metoprolol tartrate (LOPRESSOR) 25 MG tablet Take 1 tablet (25 mg total) by mouth 2 (two) times daily.  . Multiple Vitamin (MULTIVITAMIN WITH MINERALS) TABS tablet Take 1 tablet by mouth daily.  . pantoprazole (PROTONIX) 40 MG tablet Take 1 tablet (40 mg total) by mouth daily.  . potassium chloride 20 MEQ TBCR Take 20 mEq by mouth daily.  Marland Kitchen spironolactone (ALDACTONE) 50 MG tablet Take 1 tablet (50 mg total) by mouth daily.  . polyethylene glycol (GOLYTELY) 236 g solution Drink one 8 oz glass every 20 mins until stools are clear. (Patient not taking: Reported on 04/27/2016)  . prednisoLONE (ORAPRED) 15 MG/5ML solution 45m (13.355mpo daily for 25 days, then 3041m51m32mo daily for two days; then 20 (5.3ml)33mdaily for two days; then 10 mg (3.3ml)p62maily for two days then 5 mg (1.6ml)po2mily for two days (Patient not taking: Reported on 02/02/2016)    FAMILY HISTORY:  His indicated that the status of his father is unknown. He indicated that the status of his brother is unknown.    SOCIAL HISTORY: He  reports that he quit smoking about 17 years ago. He has never used smokeless tobacco. He reports that he drinks about 4.8 oz of alcohol per week . He reports that he uses drugs, including Marijuana, about 2 times per week.  REVIEW OF SYSTEMS:   Unable to obtain due to respiratory distress and lethargy  SUBJECTIVE:  Unable to obtain due to  respiratory distress and lethargy  VITAL SIGNS: BP (!) 95/49 (BP Location: Right Arm)   Pulse (!) 112   Temp (!) 102.4 F (39.1 C) (Oral)   Resp (!) 35   Ht 6' (1.829 m)   Wt 87.8 kg (193 lb 8 oz)   SpO2 100%   BMI 26.24 kg/m   HEMODYNAMICS:    VENTILATOR SETTINGS:    INTAKE / OUTPUT: I/O last 3 completed shifts: In: 501 [I.V.:466; IV Piggyback:35] Out: -   PHYSICAL EXAMINATION: General: acutely ill appearing Caucasian male, lying in bed Neuro:  Lethargic, arouses to voice, follows simple commands, PERRLA  HEENT:  Atraumatic, normocephalic, neck supple, scleral jaundice, no JVD Cardiovascular:  Tachycardic, RRR, s1s2, no M/R/G Lungs:  Clear, tachypnea (RR 40's), accessory muscle use Abdomen:  Bowel sounds positive x4, distended, tender Musculoskeletal:  Normal bulk and tone Skin:  No rashes, lesions, ulcerations, skin jaundiced  LABS:  BMET  Recent Labs Lab 04/27/16 1321 04/27/16 2127 04/28/16 0527  NA 137  --  139  K 3.0*  --  3.3*  CL 104  --  111  CO2 19*  --  18*  BUN 6  --  7  CREATININE ICTERUS AT THIS LEVEL MAY AFFECT RESULT ICTERUS AT THIS LEVEL MAY AFFECT RESULT RESULTS UNAVAILABLE DUE TO INTERFERING SUBSTANCE  GLUCOSE 134*  --  108*    Electrolytes  Recent Labs Lab 04/27/16 1321 04/28/16 0527  CALCIUM 8.0* 7.3*    CBC  Recent Labs Lab 04/27/16 1321 04/28/16 0527  WBC 16.5* 10.1  HGB 9.6* 8.1*  HCT 27.3* 23.1*  PLT 135* 109*    Coag's  Recent Labs Lab 04/27/16 1423 04/27/16 2127 04/28/16 1030  APTT 63* 66*  --   INR 1.70 1.73 1.59    Sepsis Markers  Recent Labs Lab 04/27/16 1423 04/27/16 2047 04/27/16 2127  LATICACIDVEN 2.5* 1.8  --   PROCALCITON  --   --  0.18    ABG No results for input(s): PHART, PCO2ART, PO2ART in the last 168 hours.  Liver Enzymes  Recent Labs Lab 04/27/16 1321 04/28/16 0527  AST 247* 205*  ALT 43 34  ALKPHOS 227* 194*  BILITOT 31.3* 25.2*  ALBUMIN 2.9* 2.3*    Cardiac  Enzymes No results for input(s): TROPONINI, PROBNP in the last 168 hours.  Glucose  Recent Labs Lab 04/28/16 1325  GLUCAP 70    Imaging US Abdomen Limited Ruq  Result Date: 04/28/2016 CLINICAL DATA:  Cirrhosis, decompensation EXAM: US ABDOMEN LIMITED - RIGHT UPPER QUADRANT COMPARISON:  12/23/2015 FINDINGS: Gallbladder: Sludge noted within the gallbladder. There is gallbladder wall thickening measuring 5 mm. Negative sonographic Murphy's. Common bile duct: Diameter: Normal caliber, 5-6 mm. Liver: Heterogeneous, increased echotexture throughout the liver with nodular contours compatible with patient's given history of cirrhosis. No focal abnormality or biliary duct dilatation. IMPRESSION: Changes of cirrhosis.  No focal hepatic abnormality. Gallbladder sludge with gallbladder wall thickening, likely related to liver disease. Electronically Signed   By: Rolm Baptise M.D.   On: 04/28/2016 10:50   STUDIES:  04/28/16 US Abdomen Limited>> Changes of cirrhosis.  No focal  hepatic abnormality. Gallbladder sludge with gallbladder wall thickening, likely related to liver disease.  CULTURES: 04/27/16 Blood Cultures x2>>negative>>  ANTIBIOTICS: 04/28/16 Zosyn>> 04/28/16 Vancomycin>>01/24  SIGNIFICANT EVENTS: 04/27/16-Pt admitted to [facility] unit with rectal bleeding, jaundice, and abdominal distention  04/28/16-Pt transferred to ICU due to acute encephalopathy and acute respiratory failure requiring mechanical intubation  LINES/TUBES: 04/28/16 #8 ETT>> 04/28/16 Right Femoral CVL>>  ASSESSMENT / PLAN:  PULMONARY A:   Acute Respiratory Failure secondary to metabolic acidosis  Mechanical Ventilation P:   Full vent support wean as tolerated  Maintain O2 sats >92% Prn Bronchodilators VAP Bundle  SBT when parameters met Repeat ABG today and then in am  CXR in am   CARDIOVASCULAR A:  SinusTachycardia  Hx: AV nodal reentry tachycardia, HTN, and Hypercholesteremia  P:  Continuous telemetry  monitoring Hold outpatient metoprolol and lasix for now   RENAL A:   Metabolic Acidosis P:   Monitor I&O Repeat BMP today  Sodium Bicarb gtt Replace electrolytes as indicated   GASTROINTESTINAL A:   Liver failure secondary to ETOH abuse Hx: Chronic rectal bleeding, Liver cirrhosis, and Pancreatitis P:  Keep NPO for now  Continue rectal Lactulose Continue protonix for PUD prophylaxis Follow AST/ALT's GI following, appreciate input Repeat Ammonia level in am   HEMATOLOGIC A:   Anemia secondary to acute on chronic rectal bleeding Thrombocytopenia P:  No chemical VTE prophylaxis SCD's only  Monitor for s/sx of bleeding Trend CBC's Transfuse for Hgb <7 or for symptomatic anemia  Monitor PT/INR   INFECTIOUS A:   Leukocytosis>>Resolving P:   Trend WBC's and monitor curve  Trend PCT's Continue abx as listed above  Follow blood cultures  ENDOCRINE A:   No acute issues  P:   Follow serum glucose  Hyper/Hypoglycemic protocol  NEUROLOGIC A:   Hepatic Encephalopathy At risk for ETOH withdrawal secondary to ETOH abuse hx  Hx: Polysubstance abuse and Anxiety  P:  RASS goal: 0 to -1 Fentanyl gtt and prn versed and fentanyl to maintain RASS goal   Continue folic acid, multivitamin, and thiamine  Daily WUA Lights on during the day  Promote family presence at bedside    FAMILY  - Updates: Mother updated at bedside about pts deteriorating respiratory status and need for intubation all questions answered 04/28/16  - Inter-disciplinary family meet or Palliative Care meeting due by:  05/05/16  Marda Stalker, [place] Pager [phone] (please enter 7 digits) [place] Pager [phone] (please enter 7 digits)

## 2016-04-28 NOTE — Progress Notes (Signed)
Received pt from 1c due to tachypnea, tachycardia, work of breathing, lethargic, fever, Dr Belia HemanKasa paged and is at bedside, pt intubated after talking to mother (poa) at bedside. Has #8 ETT in place, on PRVC, 15, 5, 500. Oxygen sats wnl. Family members at bedside at this time with pt.

## 2016-04-28 NOTE — Progress Notes (Addendum)
[employer] [facility] - [place] at [facility]   PATIENT NAME: Elena Cothern    MR#:  [id number]  DATE OF BIRTH:  Jul 19, 1960  SUBJECTIVE:   Patient had a fever this morning. Tylenol suppository has been given. Patient went down for ultrasound however was tremulous and was brought back due to concern for EtOH withdrawal. Patient is now lethargic. According to the nurse patient was more alert this morning.  REVIEW OF SYSTEMS:    Review of Systems  Unable to perform ROS: Acuity of condition  Constitutional: Positive for fever.    Tolerating Diet:npo      DRUG ALLERGIES:  No Known Allergies  VITALS:  Blood pressure (!) 142/69, pulse (!) 143, temperature (!) 101.2 F (38.4 C), temperature source Oral, resp. rate (!) 32, height 6' (1.829 m), weight 87.8 kg (193 lb 8 oz), SpO2 98 %.  PHYSICAL EXAMINATION:   Physical Exam  Constitutional: He is well-developed, well-nourished, and in no distress. No distress.  HENT:  Head: Normocephalic.  Eyes: Scleral icterus is present.  Neck: Normal range of motion. Neck supple. No JVD present. No tracheal deviation present.  Cardiovascular: Normal rate, regular rhythm and normal heart sounds.  Exam reveals no gallop and no friction rub.   No murmur heard. Pulmonary/Chest: Effort normal and breath sounds normal. No respiratory distress. He has no wheezes. He has no rales. He exhibits no tenderness.  Abdominal: Bowel sounds are normal. He exhibits distension. He exhibits no mass. There is no tenderness. There is no rebound and no guarding.  Musculoskeletal: He exhibits edema.  Neurological:  Lethargic   Skin: Skin is warm. No rash noted. No erythema.      LABORATORY PANEL:   CBC  Recent Labs Lab 04/28/16 0527  WBC 10.1  HGB 8.1*  HCT 23.1*  PLT 109*   ------------------------------------------------------------------------------------------------------------------  Chemistries   Recent Labs Lab 04/28/16 0527  NA  139  K 3.3*  CL 111  CO2 18*  GLUCOSE 108*  BUN 7  CREATININE RESULTS UNAVAILABLE DUE TO INTERFERING SUBSTANCE  CALCIUM 7.3*  AST 205*  ALT 34  ALKPHOS 194*  BILITOT 25.2*   ------------------------------------------------------------------------------------------------------------------  Cardiac Enzymes No results for input(s): TROPONINI in the last 168 hours. ------------------------------------------------------------------------------------------------------------------  RADIOLOGY:  US Abdomen Limited Ruq  Result Date: 04/28/2016 CLINICAL DATA:  Cirrhosis, decompensation EXAM: US ABDOMEN LIMITED - RIGHT UPPER QUADRANT COMPARISON:  12/23/2015 FINDINGS: Gallbladder: Sludge noted within the gallbladder. There is gallbladder wall thickening measuring 5 mm. Negative sonographic Murphy's. Common bile duct: Diameter: Normal caliber, 5-6 mm. Liver: Heterogeneous, increased echotexture throughout the liver with nodular contours compatible with patient's given history of cirrhosis. No focal abnormality or biliary duct dilatation. IMPRESSION: Changes of cirrhosis.  No focal hepatic abnormality. Gallbladder sludge with gallbladder wall thickening, likely related to liver disease. Electronically Signed   By: Charlett Nose M.D.   On: 04/28/2016 10:50     ASSESSMENT AND PLAN:   56 year old male with EtOH abuse and alcohol related cirrhosis admitted for rectal bleeding and liver failure.  1. Acute hepatic encephalopathy: Lactulose should be given rectally due to his mental status. Encephalopathy this morning is exacerbated by fever. IV thiamine  2. Rectal bleeding which is long-standing GI is following  3. Acute liver failure : Patient was unable to obtain Doppler to evaluate portal vein thrombosis  He is not a candidate for liver transplant due to ongoing alcohol abuse GI following  4. EtOH abuse: Continue CIWA protocol  5. Elevated LFTs due to severe  alcoholic hepatitis: Patient may  benefit from Trental  6. Sepsis with tachycardia, tachypnea and leukocytosis Continue broad-spectrum antibiotics vancomycin and Zosyn Follow-up on final cultures  7. Electrolyte abnormalities: Pharmacy consult for replacement  Patient is critically ill and high risk of cardiac pulmonary arrest with severe acute liver failure.  Called patient's mother who is contact however unable to leave message on home phone or mobile phone. We'll obtain palliative care consulted for goals of care and CODE STATUS .  CODE STATUS: FULL  Critical careTOTAL TIME TAKING CARE OF THIS PATIENT: 35 minutes.     POSSIBLE D/C ??, DEPENDING ON CLINICAL CONDITION.   Shylyn Younce M.D on 04/28/2016 at 11:06 AM  Between 7am to 6pm - Pager - [phone] After 6pm go to [email or web address] - Social research officer, governmentpassword EPAS [facility]  [facility]  Office  [phone]  CC: Primary care physician; [facility]  Note: This dictation was prepared with Dragon dictation along with smaller phrase technology. Any transcriptional errors that result from this process are unintentional.

## 2016-04-28 NOTE — Progress Notes (Signed)
Family Meeting Note  Advance Directive:no  Today a meeting took place with the patient's mother.  Patient is unable to participate due WU:[id number]:Lacked capacity Encephalopathic   The following clinical team members were present during this meeting:MD  The following were discussed:Patient's diagnosis: Severe EtOH related liver failure Ongoing EtOH abuse  Patient's progosis: < 12 months and Goals for treatment: Full Code  Additional follow-up to be provided: He is agreeable to palliative care consult for goals of care  Time spent during discussion:20 minutes  Britaney Espaillat, MD

## 2016-04-28 NOTE — Progress Notes (Signed)
Pharmacy Antibiotic Note  Cristine PolioMichael J Howe is a 56 y.o. male admitted on 04/27/2016 with sepsis.  Pharmacy has been consulted for vancomycin and Zosyn dosing.  Plan: Patient SCr unmeasurable due to colorimetric interference. Based on last SCr in chart CrCl est~ 90 mL/min. To prevent delay in therapy will start vancomycin ~15 mg/kg q 12 hours empirically with level before 3rd overall dose to monitor for accumulation.  Zosyn 3.375 grams q 8 hours ordered.  Height: 6' (182.9 cm) Weight: 193 lb 8 oz (87.8 kg) IBW/kg (Calculated) : 77.6  Temp (24hrs), Avg:98.5 F (36.9 C), Min:98.2 F (36.8 C), Max:98.7 F (37.1 C)   Recent Labs Lab 04/27/16 1321 04/27/16 1423 04/27/16 2047 04/27/16 2127 04/28/16 0527  WBC 16.5*  --   --   --  10.1  CREATININE ICTERUS AT THIS LEVEL MAY AFFECT RESULT  --   --  ICTERUS AT THIS LEVEL MAY AFFECT RESULT RESULTS UNAVAILABLE DUE TO INTERFERING SUBSTANCE  LATICACIDVEN  --  2.5* 1.8  --   --     CrCl cannot be calculated (This lab value cannot be used to calculate CrCl because it is not a number: RESULTS UNAVAILABLE DUE TO INTERFERING SUBSTANCE).    No Known Allergies  Antimicrobials this admission: vancomycin 1/24 >>  Zosyn 1/24 >>   Dose adjustments this admission:   Microbiology results: 1/23 BCx: pending   Thank you for allowing pharmacy to be a part of this patient's care.  Francile Woolford S 04/28/2016 7:27 AM

## 2016-04-28 NOTE — Progress Notes (Signed)
Ryan Bellows MD [address], [place], [place] [address] Phone: [phone] Fax : [phone]  Ryan Howe is being followed for ascites, acute liver failure, alcoholic hepatitis , anemia/rectal bleed  Day 2 of follow up   Subjective: Shaking , they could not do ultrasound due to shaking. Denies any complaints very drowsy likely from medication.    Objective: Vital signs in last 24 hours: Vitals:   04/27/16 2030 04/27/16 2119 04/27/16 2120 04/28/16 0520  BP: 131/70  139/73 122/67  Pulse: 94  (!) 102 (!) 107  Resp: (!) 23  18 (!) 24  Temp:   98.2 F (36.8 C) 98.6 F (37 C)  TempSrc:   Oral Oral  SpO2: 90%  99% 97%  Weight:  193 lb 8 oz (87.8 kg)    Height:  6' (1.829 m)     Weight change:   Intake/Output Summary (Last 24 hours) at 04/28/16 1002 Last data filed at 04/28/16 0712  Gross per 24 hour  Intake              844 ml  Output                0 ml  Net              844 ml     Exam: Heart:: Regular rate and rhythm, S1S2 present or without murmur or extra heart sounds Lungs: normal, clear to auscultation and clear to auscultation and percussion Abdomen: soft, nontender, normal bowel sounds   Lab Results: CBC Latest Ref Rng & Units 04/28/2016 04/27/2016 01/26/2016  WBC 3.8 - 10.6 K/uL 10.1 16.5(H) 14.6(H)  Hemoglobin 13.0 - 18.0 g/dL 8.1(L) 9.6(L) -  Hematocrit 40.0 - 52.0 % 23.1(L) 27.3(L) 32.9(L)  Platelets 150 - 440 K/uL 109(L) 135(L) 327    Hepatic Function Latest Ref Rng & Units 04/28/2016 04/27/2016 01/13/2016  Total Protein 6.5 - 8.1 g/dL 6.5 8.0 6.4(L)  Albumin 3.5 - 5.0 g/dL 2.3(L) 2.9(L) 3.2(L)  AST 15 - 41 U/L 205(H) 247(H) 101(H)  ALT 17 - 63 U/L 34 43 70(H)  Alk Phosphatase 38 - 126 U/L 194(H) 227(H) 167(H)  Total Bilirubin 0.3 - 1.2 mg/dL 25.2(HH) 31.3(HH) 6.9(H)  Bilirubin, Direct 0.1 - 0.5 mg/dL - - -    BMP Latest Ref Rng & Units 04/28/2016 04/27/2016 04/27/2016  Glucose 65 - 99 mg/dL 108(H) - 134(H)  BUN 6 - 20 mg/dL 7 - 6    Creatinine 0.61 - 1.24 mg/dL RESULTS UNAVAILABLE DUE TO INTERFERING SUBSTANCE ICTERUS AT THIS LEVEL MAY AFFECT RESULT ICTERUS AT THIS LEVEL MAY AFFECT RESULT  BUN/Creat Ratio 9 - 20 - - -  Sodium 135 - 145 mmol/L 139 - 137  Potassium 3.5 - 5.1 mmol/L 3.3(L) - 3.0(L)  Chloride 101 - 111 mmol/L 111 - 104  CO2 22 - 32 mmol/L 18(L) - 19(L)  Calcium 8.9 - 10.3 mg/dL 7.3(L) - 8.0(L)    Micro Results: Recent Results (from the past 240 hour(s))  Culture, blood (x 2)     Status: None (Preliminary result)   Collection Time: 04/27/16  9:27 PM  Result Value Ref Range Status   Specimen Description BLOOD LEFT AC  Final   Special Requests BOTTLES DRAWN AEROBIC AND ANAEROBIC 8CC  Final   Culture NO GROWTH < 12 HOURS  Final   Report Status PENDING  Incomplete  Culture, blood (x 2)     Status: None (Preliminary result)   Collection Time: 04/27/16  9:27 PM  Result Value Ref Range Status   Specimen Description BLOOD RIGHT HAND  Final   Special Requests BOTTLES DRAWN AEROBIC AND ANAEROBIC AER8CC ANA10CC  Final   Culture NO GROWTH < 12 HOURS  Final   Report Status PENDING  Incomplete   Studies/Results: No results found. Medications: I have reviewed the patient's current medications. Scheduled Meds: . folic acid  1 mg Intravenous Daily  . lactulose  30 g Oral TID  . LORazepam  0-4 mg Oral Q6H   Followed by  . [START ON 04/29/2016] LORazepam  0-4 mg Oral Q12H  . phytonadione  10 mg Subcutaneous Daily  . piperacillin-tazobactam (ZOSYN)  IV  3.375 g Intravenous Q8H  . sodium chloride flush  3 mL Intravenous Q12H  . thiamine  100 mg Intravenous Daily  . vancomycin  1,500 mg Intravenous Q12H   Continuous Infusions: . 0.9 % NaCl with KCl 40 mEq / L 100 mL/hr (04/27/16 2330)   PRN Meds:.albuterol, ketorolac, ondansetron **OR** ondansetron (ZOFRAN) IV, oxyCODONE   Assessment: Active Problems:   Liver failure (HCC)  Ryan Howe is a 56 y.o. y/o male with  H/o of long standing alcohol abuse  which is ongoing admitted with jaundice, rectal bleeding which is long standing  . He is presently being treated for sepsis with antibiotics- Hence he would not be a candidate for prednisolone    Plan: 1. Rectal bleeding : long standing-Needs a colonoscopy when stable and he can actually tolerate a prep, presently in alcohol withdrawal  . Monitor when in hospital   2. Abnormal LFT's- likely severe alcoholic hepatitis.His discriminant function is 62 , he may have benefited from steroids but due to sepsis will avoid and start him on Trental- usual dose is 400 mg TID , since we have no creatinine will commence on lower dose and increase once we have a result. If no better at day 7 then will stop.  Check creatinine.   3. Likely acute liver failure: R/o portal vein thrombosis with doppler F/u Acute hepatitis panel   Continue Lactulose titrated to 2 soft bowel movements daily , avoid diarrhea- no benefit checking ammonia level. If has clinical signs of confusion would treat for encephelopathy. Correct electrolytes  IV thiamine  He is not a candidate for liver transplant due to ongoing alcohol abuse, mortality rate from alcoholic hepatitis is 89-16% at one month Watch for hypoglycemia Diagnostic paracentesis to r/o SBP Absolutely no NSAID's Commence on IV PPI Nutritional consult as good nutrition reduces mortality rates in alcoholic hepatitis. Low salt diet.    LOS: 1 day   Ryan Howe 04/28/2016, 10:02 AM

## 2016-04-28 NOTE — Progress Notes (Signed)
MEDICATION RELATED CONSULT NOTE - INITIAL   Pharmacy Consult for Electrolyte management Indication: Liver cirrhosis/hypokalemia  No Known Allergies  Patient Measurements: Height: 6' (182.9 cm) Weight: 193 lb 8 oz (87.8 kg) IBW/kg (Calculated) : 77.6 Adjusted Body Weight:   Vital Signs: Temp: 103 F (39.4 C) (01/24 1149) Temp Source: Oral (01/24 1149) BP: 142/69 (01/24 1027) Pulse Rate: 143 (01/24 1033) Intake/Output from previous day: 01/23 0701 - 01/24 0700 In: 501 [I.V.:466; IV Piggyback:35] Out: -  Intake/Output from this shift: Total I/O In: 343 [I.V.:343] Out: 0   Labs:  Recent Labs  04/27/16 1321 04/27/16 1423 04/27/16 2127 04/28/16 0527  WBC 16.5*  --   --  10.1  HGB 9.6*  --   --  8.1*  HCT 27.3*  --   --  23.1*  PLT 135*  --   --  109*  APTT  --  63* 66*  --   CREATININE ICTERUS AT THIS LEVEL MAY AFFECT RESULT  --  ICTERUS AT THIS LEVEL MAY AFFECT RESULT RESULTS UNAVAILABLE DUE TO INTERFERING SUBSTANCE  ALBUMIN 2.9*  --   --  2.3*  PROT 8.0  --   --  6.5  AST 247*  --   --  205*  ALT 43  --   --  34  ALKPHOS 227*  --   --  194*  BILITOT 31.3*  --   --  25.2*   CrCl cannot be calculated (This lab value cannot be used to calculate CrCl because it is not a number: RESULTS UNAVAILABLE DUE TO INTERFERING SUBSTANCE).   Microbiology: Recent Results (from the past 720 hour(s))  Culture, blood (x 2)     Status: None (Preliminary result)   Collection Time: 04/27/16  9:27 PM  Result Value Ref Range Status   Specimen Description BLOOD LEFT AC  Final   Special Requests BOTTLES DRAWN AEROBIC AND ANAEROBIC 8CC  Final   Culture NO GROWTH < 12 HOURS  Final   Report Status PENDING  Incomplete  Culture, blood (x 2)     Status: None (Preliminary result)   Collection Time: 04/27/16  9:27 PM  Result Value Ref Range Status   Specimen Description BLOOD RIGHT HAND  Final   Special Requests BOTTLES DRAWN AEROBIC AND ANAEROBIC AER8CC ANA10CC  Final   Culture NO GROWTH  < 12 HOURS  Final   Report Status PENDING  Incomplete    Medical History: Past Medical History:  Diagnosis Date  . Anemia of chronic disease   . Anxiety   . AVNRT (AV nodal re-entry tachycardia) (HCC)   . Cirrhosis (HCC)   . GERD (gastroesophageal reflux disease)   . Hypercholesteremia   . Hypertension   . Liver failure (HCC)   . Numbness of toes   . Pancreatitis   . Polysubstance abuse    a. former cocaine and tobacco abuse, ongoing alcohol and marijuana abuse  . Thrombocytopenia (HCC)     Medications:  Scheduled:  . folic acid  1 mg Intravenous Daily  . lactulose  300 mL Rectal TID  . LORazepam  0-4 mg Oral Q6H   Followed by  . [START ON 04/29/2016] LORazepam  0-4 mg Oral Q12H  . pantoprazole (PROTONIX) IV  40 mg Intravenous Q24H  . pentoxifylline  400 mg Oral QAC breakfast  . phytonadione  10 mg Subcutaneous Daily  . piperacillin-tazobactam (ZOSYN)  IV  3.375 g Intravenous Q8H  . potassium chloride (KCL MULTIRUN) 30 mEq in 265 mL IVPB  30 mEq Intravenous Once  . sodium chloride flush  3 mL Intravenous Q12H  . thiamine  100 mg Intravenous Daily  . vancomycin  1,500 mg Intravenous Q12H    Assessment: Patient admitted for rectal bleeding and h/o liver cirrhosis--currently appears to be septic and is on vanc/zosyn K+ 3.3 Mg -- ordered for 1/25 w/ AM labs Phos -- ordered for 1/25 w/ AM labs Ca 7.3 -- corrected 8.6  Goal of Therapy:  K+ 3.5 - 4.5 Mg > 2.0 Phos 2.5 - 4.5 Ca 8.6 - 10.2  Plan:  Will replace potassium with Kcl 30 mEq IV x 1 and will recheck CMP/Mg/Phos with AM labs 1/25. Will continue to monitor electrolytes and renal function.  Thank you for this consult.  Thomasene Rippleavid Cashae Weich, PharmD, BCPS Clinical Pharmacist 04/28/2016

## 2016-04-28 NOTE — Plan of Care (Signed)
Problem: Activity: Goal: Risk for activity intolerance will decrease Outcome: Not Progressing Patient is very weak when attempting [facility].

## 2016-04-29 DIAGNOSIS — Z515 Encounter for palliative care: Secondary | ICD-10-CM

## 2016-04-29 DIAGNOSIS — Z66 Do not resuscitate: Secondary | ICD-10-CM

## 2016-04-29 DIAGNOSIS — Z7189 Other specified counseling: Secondary | ICD-10-CM

## 2016-04-29 LAB — COMPREHENSIVE METABOLIC PANEL
ALBUMIN: 2.2 g/dL — AB (ref 3.5–5.0)
ALK PHOS: 175 U/L — AB (ref 38–126)
ALT: 36 U/L (ref 17–63)
AST: 240 U/L — ABNORMAL HIGH (ref 15–41)
Anion gap: 8 (ref 5–15)
BILIRUBIN TOTAL: 26.3 mg/dL — AB (ref 0.3–1.2)
BUN: 17 mg/dL (ref 6–20)
CHLORIDE: 112 mmol/L — AB (ref 101–111)
CO2: 25 mmol/L (ref 22–32)
CREATININE: UNDETERMINED mg/dL (ref 0.61–1.24)
Calcium: 6.8 mg/dL — ABNORMAL LOW (ref 8.9–10.3)
Glucose, Bld: 172 mg/dL — ABNORMAL HIGH (ref 65–99)
Potassium: 3.8 mmol/L (ref 3.5–5.1)
SODIUM: 145 mmol/L (ref 135–145)
TOTAL PROTEIN: 6.4 g/dL — AB (ref 6.5–8.1)

## 2016-04-29 LAB — CBC
HCT: 23.4 % — ABNORMAL LOW (ref 40.0–52.0)
Hemoglobin: 8.2 g/dL — ABNORMAL LOW (ref 13.0–18.0)
MCH: 33.8 pg (ref 26.0–34.0)
MCHC: 35 g/dL (ref 32.0–36.0)
MCV: 96.6 fL (ref 80.0–100.0)
PLATELETS: 109 10*3/uL — AB (ref 150–440)
RBC: 2.42 MIL/uL — AB (ref 4.40–5.90)
RDW: 23 % — AB (ref 11.5–14.5)
WBC: 13.3 10*3/uL — AB (ref 3.8–10.6)

## 2016-04-29 LAB — MAGNESIUM
Magnesium: 1.3 mg/dL — ABNORMAL LOW (ref 1.7–2.4)
Magnesium: 2.4 mg/dL (ref 1.7–2.4)
Magnesium: 2.3 mg/dL (ref 1.7–2.4)

## 2016-04-29 LAB — HEPATITIS B DNA, ULTRAQUANTITATIVE, PCR
HBV DNA SERPL PCR-ACNC: NOT DETECTED IU/mL
HBV DNA SERPL PCR-LOG IU: UNDETERMINED log10IU/mL

## 2016-04-29 LAB — HEPATITIS B SURFACE ANTIGEN: HEP B S AG: NEGATIVE

## 2016-04-29 LAB — PHOSPHORUS
PHOSPHORUS: UNDETERMINED mg/dL (ref 2.5–4.6)
PHOSPHORUS: UNDETERMINED mg/dL (ref 2.5–4.6)
Phosphorus: UNDETERMINED mg/dL (ref 2.5–4.6)

## 2016-04-29 LAB — GLUCOSE, CAPILLARY
GLUCOSE-CAPILLARY: 142 mg/dL — AB (ref 65–99)
GLUCOSE-CAPILLARY: 144 mg/dL — AB (ref 65–99)
GLUCOSE-CAPILLARY: 150 mg/dL — AB (ref 65–99)

## 2016-04-29 LAB — AMMONIA: AMMONIA: 97 umol/L — AB (ref 9–35)

## 2016-04-29 LAB — CERULOPLASMIN: Ceruloplasmin: 43 mg/dL — ABNORMAL HIGH (ref 16.0–31.0)

## 2016-04-29 LAB — HEPATITIS C ANTIBODY: HCV Ab: 0.1 s/co ratio (ref 0.0–0.9)

## 2016-04-29 MED ORDER — STERILE WATER FOR INJECTION IJ SOLN
INTRAMUSCULAR | Status: AC
  Administered 2016-04-29: 10 mL
  Filled 2016-04-29: qty 10

## 2016-04-29 MED ORDER — MAGNESIUM SULFATE 4 GM/100ML IV SOLN
4.0000 g | Freq: Once | INTRAVENOUS | Status: AC
  Administered 2016-04-29: 4 g via INTRAVENOUS
  Filled 2016-04-29: qty 100

## 2016-04-29 MED ORDER — ALTEPLASE 2 MG IJ SOLR
2.0000 mg | Freq: Once | INTRAMUSCULAR | Status: AC
Start: 2016-04-29 — End: 2016-04-29
  Administered 2016-04-29: 2 mg
  Filled 2016-04-29: qty 2

## 2016-04-29 MED ORDER — ORAL CARE MOUTH RINSE
15.0000 mL | Freq: Four times a day (QID) | OROMUCOSAL | Status: DC
  Administered 2016-04-29 – 2016-05-02 (×9): 15 mL via OROMUCOSAL

## 2016-04-29 MED ORDER — VITAL HIGH PROTEIN PO LIQD: 1000.0000 mL | ORAL | Status: DC

## 2016-04-29 MED ORDER — LACTULOSE 10 GM/15ML PO SOLN
30.0000 g | Freq: Two times a day (BID) | ORAL | Status: DC
  Administered 2016-04-29 – 2016-05-03 (×7): 30 g via ORAL
  Filled 2016-04-29 (×8): qty 60

## 2016-04-29 MED ORDER — CHLORHEXIDINE GLUCONATE 0.12% ORAL RINSE (MEDLINE KIT)
15.0000 mL | Freq: Two times a day (BID) | OROMUCOSAL | Status: DC
  Administered 2016-04-29 – 2016-05-03 (×7): 15 mL via OROMUCOSAL

## 2016-04-29 MED ORDER — VITAL AF 1.2 CAL PO LIQD
1000.0000 mL | ORAL | Status: DC
  Administered 2016-04-29: 1000 mL

## 2016-04-29 NOTE — Progress Notes (Signed)
Sonda Rumbleana Blakeney NP notified of low urine output.

## 2016-04-29 NOTE — Progress Notes (Signed)
Pharmacy Antibiotic Note  Ryan Howe is a 56 y.o. male admitted on 04/27/2016 with sepsis.  Pharmacy has been consulted for Zosyn dosing.  Plan: No recent serum creatinine -- [facility]15.  Vancomycin D/C'd.  Continue Zosyn 3.375 grams q 8 hours ordered. Continue to monitor urine output as serum creatinine cannot be determined at this time.   Height: 6' (182.9 cm) Weight: 193 lb 8 oz (87.8 kg) IBW/kg (Calculated) : 77.6  Temp (24hrs), Avg:98.5 F (36.9 C), Min:98.2 F (36.8 C), Max:98.7 F (37.1 C)   Last Labs    Recent Labs Lab 04/27/16 1321 04/27/16 1423 04/27/16 2047 04/27/16 2127 04/28/16 0527  WBC 16.5*  --   --   --  10.1  CREATININE ICTERUS AT THIS LEVEL MAY AFFECT RESULT  --   --  ICTERUS AT THIS LEVEL MAY AFFECT RESULT RESULTS UNAVAILABLE DUE TO INTERFERING SUBSTANCE  LATICACIDVEN  --  2.5* 1.8  --   --       CrCl cannot be calculated (This lab value cannot be used to calculate CrCl because it is not a number: RESULTS UNAVAILABLE DUE TO INTERFERING SUBSTANCE).    No Known Allergies  Antimicrobials this admission: vancomycin 1/24 >> 1/24 Zosyn 1/24 >>     Microbiology results: 1/23 BCx: pending  Thank you for allowing pharmacy to be a part of this patient's care.  Delsa BernKelly m Fuhrmann, PharmD 04/29/2016

## 2016-04-29 NOTE — Progress Notes (Signed)
Initial Nutrition Assessment  DOCUMENTATION CODES:   Not applicable  INTERVENTION:  -Received verbal order from MD Kasa to being TF during ICU rounds. Recommend starting Vital AF 1.2 at rate of 20 ml/hr with goal of 75 ml/hr providing 2160 kcals, 135 g of protein and 1458 mL of free water. Meets 100% estimated needs.    NUTRITION DIAGNOSIS:   Inadequate oral intake related to acute illness as evidenced by NPO status.  GOAL:   Provide needs based on ASPEN/SCCM guidelines  MONITOR:   Vent status, Labs, Weight trends, TF tolerance  REASON FOR ASSESSMENT:   Ventilator    ASSESSMENT:   56 yo male admitted with abdominal distention, rectal bleeding and jaundice; developed acute encepahalopathy and  acute respiratory failure secondary to metabolic acidosis, hepatic encephalopathy/acute liver failure. Pt with severe alcoholic hepatitis. Pt with hx of long standing alcohol abuse and chronic rectal bleeding  Pt required intubation 1/25 OG tube with tip in stomach   Patient is currently intubated on ventilator support, fentanyl for sedation MV: 7.6 L/min Temp (24hrs), Avg:99.4 F (37.4 C), Min:98.6 F (37 C), Max:102.4 F (39.1 C)  Labs: ammonia 97, total bilirubin 26.3 (H) magnesium 1.3 (supplemented), phosphorus not reported due to icterus Meds: lactulose, MVI, sodium bicarb at 100 ml/hr, folic acid, thiamine  Diet Order:  Diet NPO time specified Except for: Sips with Meds  Skin:  Reviewed, no issues  Last BM:  04/29/16 rectal tube in place  Height:   Ht Readings from Last 1 Encounters:  04/27/16 6' (1.829 m)    Weight:   Wt Readings from Last 1 Encounters:  04/29/16 195 lb 8.8 oz (88.7 kg)    Ideal Body Weight:     BMI:  Body mass index is 26.52 kg/m.  Estimated Nutritional Needs:   Kcal:  2297 kcals  Protein:  133-178 g  Fluid:  >/= 1.5 L  EDUCATION NEEDS:   No education needs identified at this time  Romelle StarcherCate Samauri Kellenberger MS, RD, LDN [phone] Pager   [phone] Weekend/On-Call Pager

## 2016-04-29 NOTE — Progress Notes (Addendum)
Notified Dr. Belia HemanKasa that patient is sedated with fentanyl though following commands- Ammonia, bilirubin and liver enzymes are elevated.  Creatinine is undetected- patient had 200ml urine output last night.  No new orders from Dr. Belia HemanKasa.  He stated that we will continue to monitor.

## 2016-04-29 NOTE — H&P (Signed)
PULMONARY / CRITICAL CARE MEDICINE   Name: Ryan Howe MRN: [id number] DOB: Sep 02, 1960    ADMISSION DATE:  04/27/2016 CONSULTATION DATE:  04/28/16  REFERRING MD:  Dr. Benjie Karvonen  CHIEF COMPLAINT:  Respiratory Distress  HISTORY OF PRESENT ILLNESS:   Ryan Howe is a 56 y.o. Male with a known history of Chronic Rectal Bleeding, Alcohol Abuse, Cirrhosis, Liver Failure, Hypertension, Hyperlipidemia, and Pancreatitis.  He presented to  [facility] ED on 04/27/16 with complaints of rectal bleeding that had worsened over the past few days from baseline, abdominal distention, and jaundice. He is a current every day drinker (at lease 8 ounces of whiskey per day and occasionally drinks 6 beers), last known drink was 04/27/16. In the ED he was found to be tachycardic, tachypneic with leukocytosis and an elevated bilirubin 31.3. He was subsequently admitted to [facility] unit for further treatment. On 04/28/16 pt noted to be in respiratory distress and lethargic he was subsequently transferred to ICU requiring mechanical intubation.  PCCM consulted 01/24 for management of acute respiratory failure and acute encephalopathy requiring mechanical intubation.    REVIEW OF SYSTEMS:   Unable to obtain due to respiratory distress and lethargy  SUBJECTIVE:  Unable to obtain due to respiratory distress Now intubated,sedated, progressive liver failure Prognosis is very poor  VITAL SIGNS: BP 112/74   Pulse (!) 103   Temp 98.6 F (37 C) (Oral)   Resp 18   Ht 6' (1.829 m)   Wt 195 lb 8.8 oz (88.7 kg)   SpO2 98%   BMI 26.52 kg/m   HEMODYNAMICS:    VENTILATOR SETTINGS: Vent Mode: PRVC FiO2 (%):  [70 %-80 %] 70 % Set Rate:  [15 bmp] 15 bmp Vt Set:  [500 mL] 500 mL PEEP:  [5 cmH20] 5 cmH20  INTAKE / OUTPUT: I/O last 3 completed shifts: In: 7902 [I.V.:2680; Other:1000; IV Piggyback:670] Out: 4097 [Urine:825; Stool:450]  PHYSICAL EXAMINATION: General: acutely ill appearing Caucasian male, lying in bed Neuro:   Sedated HEENT:  Atraumatic, normocephalic, neck supple, scleral jaundice, no JVD Cardiovascular:  Tachycardic, RRR, s1s2, no M/R/G Lungs:  CTA b/l Abdomen:  Bowel sounds positive x4, distended, tender Musculoskeletal:  Normal bulk and tone Skin:  No rashes, lesions, ulcerations, skin jaundiced gcs<8T LABS:  BMET  Recent Labs Lab 04/28/16 0527 04/28/16 2002 04/29/16 0333  NA 139 143 145  K 3.3* 4.2 3.8  CL 111 113* 112*  CO2 18* 20* 25  BUN _0 CREATININE RESULTS UNAVAILABLE DUE TO INTERFERING SUBSTANCE UNABLE TO REPORT DUE TO ICTERUS UNABLE TO REPORT DUE TO ICTERUS  GLUCOSE 108* 174* 172*    Electrolytes  Recent Labs Lab 04/28/16 0527 04/28/16 2002 04/29/16 0333  CALCIUM 7.3* 7.0* 6.8*  MG  --   --  1.3*  PHOS  --   --  UNABLE TO REPORT DUE TO ICTERUS    CBC  Recent Labs Lab 04/27/16 1321 04/28/16 0527 04/29/16 0333  WBC 16.5* 10.1 13.3*  HGB 9.6* 8.1* 8.2*  HCT 27.3* 23.1* 23.4*  PLT 135* 109* 109*    Coag's  Recent Labs Lab 04/27/16 1423 04/27/16 2127 04/28/16 1030  APTT 63* 66*  --   INR 1.70 1.73 1.59    Sepsis Markers  Recent Labs Lab 04/27/16 1423 04/27/16 2047 04/27/16 2127  LATICACIDVEN 2.5* 1.8  --   PROCALCITON  --   --  0.18    ABG  Recent Labs Lab 04/28/16 1430 04/28/16 2000  PHART 7.19* 7.38  PCO2ART 42  33  PO2ART 107 PENDING    Liver Enzymes  Recent Labs Lab 04/27/16 1321 04/28/16 0527 04/29/16 0333  AST 247* 205* 240*  ALT 43 34 36  ALKPHOS 227* 194* 175*  BILITOT 31.3* 25.2* 26.3*  ALBUMIN 2.9* 2.3* 2.2*    Cardiac Enzymes No results for input(s): TROPONINI, PROBNP in the last 168 hours.  Glucose  Recent Labs Lab 04/28/16 1325  GLUCAP 70    Imaging Dg Chest 1 View  Result Date: 04/28/2016 CLINICAL DATA:  56 year old male intubated and line placement. Initial encounter. EXAM: CHEST 1 VIEW COMPARISON:  01/11/2016. FINDINGS: Portable AP semi upright view at 1714 hours. Intubated,  endotracheal tube tip about 2.5 cm above the carina. Enteric tube in place, courses to the left upper quadrant with tip not included. Mildly lower lung volumes. Dense partial retrocardiac opacity along the medial left hemidiaphragm. Otherwise when Allowing for portable technique the lungs are clear. No pneumothorax. Stable cardiac size and mediastinal contours. IMPRESSION: 1. Endotracheal tube in good position. Enteric tube courses to the abdomen. 2. Left lower lobe atelectasis or consolidation. No other acute cardiopulmonary abnormality. Electronically Signed   By: Genevie Ann M.D.   On: 04/28/2016 17:26   Dg Abd 1 View  Result Date: 04/28/2016 CLINICAL DATA:  56 year old male intubated and line placement. Initial encounter. EXAM: ABDOMEN - 1 VIEW COMPARISON:  Portable chest from today reported separately. Abdomen series 32817. FINDINGS: Portable AP view at 1705 hours. Enteric tube courses to the left upper quadrant in the side holes at the level of the gastric body. There is a central vascular catheter projecting over the right sacrum. Increased gas in small and large bowel loops in the mid abdomen, but the pattern remains non obstructed. Left lung base confluent opacity re- demonstrated. No acute osseous abnormality identified. IMPRESSION: 1. Enteric tube side hole up the level of the gastric body. Central vascular catheter projects over the right sacrum. 2. Increased bowel gas but nonobstructed pattern at this time. Electronically Signed   By: Genevie Ann M.D.   On: 04/28/2016 17:28   US Abdomen Limited Ruq  Result Date: 04/28/2016 CLINICAL DATA:  Cirrhosis, decompensation EXAM: US ABDOMEN LIMITED - RIGHT UPPER QUADRANT COMPARISON:  12/23/2015 FINDINGS: Gallbladder: Sludge noted within the gallbladder. There is gallbladder wall thickening measuring 5 mm. Negative sonographic Murphy's. Common bile duct: Diameter: Normal caliber, 5-6 mm. Liver: Heterogeneous, increased echotexture throughout the liver with nodular  contours compatible with patient's given history of cirrhosis. No focal abnormality or biliary duct dilatation. IMPRESSION: Changes of cirrhosis.  No focal hepatic abnormality. Gallbladder sludge with gallbladder wall thickening, likely related to liver disease. Electronically Signed   By: Rolm Baptise M.D.   On: 04/28/2016 10:50   STUDIES:  04/28/16 US Abdomen Limited>> Changes of cirrhosis.  No focal hepatic abnormality. Gallbladder sludge with gallbladder wall thickening, likely related to liver disease.  CULTURES: 04/27/16 Blood Cultures x2>>negative>>  ANTIBIOTICS: 04/28/16 Zosyn>> 04/28/16 Vancomycin>>01/24  SIGNIFICANT EVENTS: 04/27/16-Pt admitted to [facility] unit with rectal bleeding, jaundice, and abdominal distention  04/28/16-Pt transferred to ICU due to acute encephalopathy and acute respiratory failure requiring mechanical intubation  LINES/TUBES: 04/28/16 #8 ETT>> 04/28/16 Right Femoral CVL>>  ASSESSMENT / PLAN:  PULMONARY A:   Acute Respiratory Failure secondary to metabolic acidosis  Mechanical Ventilation P:   Full vent support wean as tolerated  Maintain O2 sats >92% Prn Bronchodilators VAP Bundle  SBT when parameters met  CARDIOVASCULAR A:  SinusTachycardia  Hx: AV nodal reentry tachycardia, HTN, and  Hypercholesteremia  P:  Continuous telemetry monitoring Hold outpatient metoprolol and lasix for now   RENAL A:   Metabolic Acidosis P:   Monitor I&O Repeat BMP today  Sodium Bicarb gtt Replace electrolytes as indicated   GASTROINTESTINAL A:   Liver failure secondary to ETOH abuse Hx: Chronic rectal bleeding, Liver cirrhosis, and Pancreatitis P:  Start Tf's  Continue oral Lactulose Continue protonix for PUD prophylaxis Follow AST/ALT's GI following, appreciate input  HEMATOLOGIC A:   Anemia secondary to acute on chronic rectal bleeding Thrombocytopenia P:  No chemical VTE prophylaxis SCD's only  Monitor for s/sx of bleeding Trend  CBC's Transfuse for Hgb <7 or for symptomatic anemia  Monitor PT/INR   INFECTIOUS A:   Leukocytosis>>Resolving P:   Trend WBC's and monitor curve  Trend PCT's Continue abx as listed above  Follow blood cultures  ENDOCRINE A:   No acute issues  P:   Follow serum glucose  Hyper/Hypoglycemic protocol  NEUROLOGIC A:   Hepatic Encephalopathy At risk for ETOH withdrawal secondary to ETOH abuse hx  Hx: Polysubstance abuse and Anxiety  P:  RASS goal: 0 to -1 Fentanyl gtt and prn versed and fentanyl to maintain RASS goal   Continue folic acid, multivitamin, and thiamine  Daily WUA Lights on during the day  Promote family presence at bedside   I have personally obtained a history, examined the patient, evaluated Pertinent laboratory and RadioGraphic/imaging results, and  formulated the assessment and plan   The Patient requires high complexity decision making for assessment and support, frequent evaluation and titration of therapies, application of advanced monitoring technologies and extensive interpretation of multiple databases. Critical Care Time devoted to patient care services described in this note is 35 minutes.   Overall, patient is critically ill, prognosis is guarded.  Patient with Multiorgan failure and at high risk for cardiac arrest and death.   Palliative care consult, recommend DNR status Overall poor prognosis  Elzora Cullins Patricia Pesa, M.D.  [facility]  Medical Director [place] Director [place] Department

## 2016-04-29 NOTE — Consult Note (Signed)
Consultation Note Date: 04/29/16  Patient Name: Ryan Howe  DOB: [birth year]  MRN: [id number]  Age / Sex: 56 y.o., male  PCP: [facility] Referring Physician: Flora Lipps, MD  Reason for Consultation: Establishing goals of care  HPI/Patient Profile: 56 y.o. male  with past medical history of alcohol abuse, cirrhosis, chronic rectal bleeding, hypertension, hyperlipidemia, pancreatitis, GERD, anxiety, and anemia admitted on 04/27/2016 with worsening rectal bleeding, abdominal distension, and jaundice. Current every day drinker-whiskey and beer. Last drink 04/27/16. In ED, patient with tachycardia and tachypnea. Leukocytosis and elevated bilirubin of 31.3. On 1/24, patient was lethargic and went into respiratory distress. He was transferred to ICU, intubated, and on vent full support. Metabolic acidosis-on bicarb gtt. Liver failure secondary to ETOH abuse-GI consulted. Patient receiving lactulose. He is not a candidate for liver transplant due to ongoing alcohol abuse. Unable to complete doppler to rule out portal vein thrombosis. Overall, patient with multiorgan failure, high risk for cardiac arrest/death, and with poor prognosis. Palliative medicine consultation to discuss goals of care/EOL.  Clinical Assessment and Goals of Care: I have reviewed medical records, discussed with Dr. Mortimer Fries, and met with mother Kalman Shan) at bedside to discuss diagnosis, prognosis, GOC, EOL wishes, and options. Patient does open eyes immediately when I called his name and shakes head no to pain. Otherwise, patient was lethargic during our conversation. Introduced Palliative Medicine.  We discussed a brief life review of the patient. Mr. Fatheree has never been married and does not have any children. He has lived with his mother for over 28 years. His mother speaks of his battle with drug and alcohol addiction his entire life.  He has family history of alcohol abuse (grandfather and brothers). Ten years ago, he went to rehab for crack/cocaine and was able to quit. He then started to drink alcohol. 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 He has not worked in the past year and Kalman Shan tells me "he lays in bed and drinks all day." He drinks whiskey, wine, and beer. Before his decline, Mr. Senat loved to be outside with his vegetable garden and enjoyed cooking.   We discussed in length diagnoses, medical interventions, and poor prognosis. Rose understands he is in very critical condition and likely will not survive this hospitalization. She states "he drank himself to death." She is very tearful during the conversation and feels she should have done more to save him. She has told the family and they understand he is at the end of his life.    Discussed and educated on code status and my recommendation for DNR (no re-intubation once extubated). Rose agrees with DNR knowing the unlikelihood of him surviving resuscitation and if he did survive,  it would not change the fact that he is in liver failure and not a candidate for a transplant.   Discussed what withdrawal of care/comfort care would look like. Educated on focus of symptom management and relief from suffering at the end-of-life. She hopes he may wean from the ventilator so she does not have to make the decision to discontinue the vent alone. I explained that she will likely have to make this decision in the next few days when she is ready. We decided he WILL NOT be re-intubated once extubated and the focus would be on comfort.   Rose asked about hospice. Briefly discussed that this  may be an option if extubated and death is not imminent but also that he may die within hours after extubation. She states her understanding.   Spiritual family and ok with chaplain visits. Questions and concerns addressed. Hard Choices copy given. PMT will continue to support holistically.   SUMMARY OF RECOMMENDATIONS    Changed code status to DNR/DNI. No re-intubation once extubated. Focus would be comfort measures only.   Mother understands poor prognosis and likely hospital death but wants to give him a chance to wean from the ventilator. She fears making this decision alone. Explained that she will likely be faced with this decision in the near future.   PMT will continue to shadow chart and provide support to patient and family.   Code Status/Advance Care Planning:  DNR   Symptom Management:   Per attending  Palliative Prophylaxis:   Aspiration, Delirium Protocol, Oral Care and Turn Reposition  Additional Recommendations (Limitations, Scope, Preferences):  Full Scope Treatment  Psycho-social/Spiritual:   Desire for further Chaplaincy support:yes  Additional Recommendations: Caregiving  Support/Resources and Education on Hospice  Prognosis:   Unable to determine very poor with multiorgan failure and in ICU on full support ventilator.   Discharge Planning: To Be Determined likely hospital death     Primary Diagnoses: Present on Admission: . Acute liver failure without hepatic coma   I have reviewed the medical record, interviewed the patient and family, and examined the patient. The following aspects are pertinent.  Past Medical History:  Diagnosis Date  . Anemia of chronic disease   . Anxiety   . AVNRT (AV nodal re-entry tachycardia) ([place])   . Cirrhosis ([place])   . GERD (gastroesophageal reflux disease)   . Hypercholesteremia   . Hypertension   . Liver failure ([place])   . Numbness of toes   . Pancreatitis   . Polysubstance abuse    a. former cocaine  and tobacco abuse, ongoing alcohol and marijuana abuse  . Thrombocytopenia [facility])    Social History   Social History  . Marital status: Single    Spouse name: N/A  . Number of children: N/A  . Years of education: N/A   Social History Main Topics  . Smoking status: Former Smoker    Quit date: 04/06/1999  . Smokeless tobacco: Never Used  . Alcohol use 4.8 oz/week    8 Cans of beer per week     Comment: pt reports quit 01/22/16, was drinking heavily  . Drug use: Yes    Frequency: 2.0 times per week    Types: Marijuana     Comment: former cocaine user  . Sexual activity: Not Asked   Other Topics Concern  . None   Social History Narrative  . None   Family History  Problem Relation Age of Onset  . Pancreatitis Brother   . Heart  failure Father    Scheduled Meds: . chlorhexidine gluconate (MEDLINE KIT)  15 mL Mouth Rinse BID  . folic acid  1 mg Intravenous Daily  . lactulose  30 g Oral BID  . mouth rinse  15 mL Mouth Rinse QID  . multivitamin  15 mL Oral Daily  . pantoprazole (PROTONIX) IV  40 mg Intravenous Q24H  . pentoxifylline  400 mg Oral QAC breakfast  . piperacillin-tazobactam (ZOSYN)  IV  3.375 g Intravenous Q8H  . sodium chloride flush  3 mL Intravenous Q12H  . thiamine  100 mg Intravenous Daily   Continuous Infusions: . feeding supplement (VITAL AF 1.2 CAL) 1,000 mL (04/30/16 0430)  . fentaNYL infusion INTRAVENOUS 200 mcg/hr (04/30/16 0410)  .  sodium bicarbonate  infusion 1000 mL 100 mL/hr at 04/30/16 0400   PRN Meds:.acetaminophen, albuterol, fentaNYL, ketorolac, midazolam, midazolam, ondansetron **OR** ondansetron (ZOFRAN) IV, oxyCODONE, sennosides Medications Prior to Admission:  Prior to Admission medications   Medication Sig Start Date End Date Taking? Authorizing Provider  citalopram (CELEXA) 10 MG tablet Take 1 tablet (10 mg total) by mouth daily. 01/26/16  Yes Minna Merritts, MD  feeding supplement, ENSURE ENLIVE, (ENSURE ENLIVE) LIQD Take 237 mLs by  mouth 2 (two) times daily between meals. 12/26/15  Yes Loletha Grayer, MD  ferrous sulfate 325 (65 FE) MG tablet Take 1 tablet (325 mg total) by mouth 2 (two) times daily with a meal. 01/13/16  Yes Srikar Sudini, MD  folic acid (FOLVITE) 1 MG tablet Take 1 tablet (1 mg total) by mouth daily. 12/27/15  Yes Loletha Grayer, MD  furosemide (LASIX) 40 MG tablet Take 1 tablet (40 mg total) by mouth every other day. 02/23/16  Yes Minna Merritts, MD  lactulose (CHRONULAC) 10 GM/15ML solution Take 45 mLs (30 g total) by mouth 2 (two) times daily. 12/26/15  Yes Loletha Grayer, MD  MELATONIN PO    Yes Historical Provider, MD  metoprolol tartrate (LOPRESSOR) 25 MG tablet Take 1 tablet (25 mg total) by mouth 2 (two) times daily. 12/26/15  Yes Loletha Grayer, MD  Multiple Vitamin (MULTIVITAMIN WITH MINERALS) TABS tablet Take 1 tablet by mouth daily. 12/27/15  Yes Richard Leslye Peer, MD  pantoprazole (PROTONIX) 40 MG tablet Take 1 tablet (40 mg total) by mouth daily. 01/13/16  Yes Srikar Sudini, MD  potassium chloride 20 MEQ TBCR Take 20 mEq by mouth daily. 01/13/16  Yes Srikar Sudini, MD  spironolactone (ALDACTONE) 50 MG tablet Take 1 tablet (50 mg total) by mouth daily. 01/13/16  Yes Srikar Sudini, MD  polyethylene glycol (GOLYTELY) 236 g solution Drink one 8 oz glass every 20 mins until stools are clear. Patient not taking: Reported on 04/27/2016 01/21/16   Lucilla Lame, MD  prednisoLONE (ORAPRED) 15 MG/5ML solution 74m (13.319mpo daily for 25 days, then 3072m57m22mo daily for two days; then 20 (5.3ml)82mdaily for two days; then 10 mg (3.3ml)p59maily for two days then 5 mg (1.6ml)po36mily for two days Patient not taking: Reported on 02/02/2016 12/26/15   RichardLoletha GrayerNo Known Allergies Review of Systems  Unable to perform ROS: Acuity of condition   Physical Exam  Constitutional: He appears lethargic. He has a sickly appearance. He is sedated and intubated.  HENT:  Head: Normocephalic and  atraumatic.  Eyes: Scleral icterus is present.  Neck: JVD present.  Cardiovascular: Normal heart sounds.  Tachycardia present.   Pulmonary/Chest: No accessory muscle usage. No tachypnea. He is intubated. No respiratory distress. He  has decreased breath sounds.  Abdominal: He exhibits distension. Bowel sounds are decreased. There is no tenderness.  Musculoskeletal: He exhibits edema (generalized).  Neurological: He appears lethargic.  Opened eyes one time to name and nodded no when asked if he has any pain. Otherwise, lethargic during my conversation.   Skin: Skin is warm.  Jaundice   Psychiatric: Cognition and memory are impaired. He is inattentive.  Nursing note and vitals reviewed.  Vital Signs: BP 136/79   Pulse (!) 111   Temp 98.8 F (37.1 C) (Oral)   Resp 13   Ht 6' (1.829 m)   Wt 93.5 kg (206 lb 2.1 oz)   SpO2 98%   BMI 27.96 kg/m  Pain Assessment: No/denies pain   Pain Score: 0-No pain   SpO2: SpO2: 98 % O2 Device:SpO2: 98 % O2 Flow Rate: .O2 Flow Rate (L/min): 0 L/min  IO: Intake/output summary:   Intake/Output Summary (Last 24 hours) at 04/30/16 0736 Last data filed at 04/30/16 0700  Gross per 24 hour  Intake          3631.63 ml  Output             1500 ml  Net          2131.63 ml    LBM: Last BM Date: 04/30/16 Baseline Weight: Weight: 87.1 kg (192 lb) Most recent weight: Weight: 93.5 kg (206 lb 2.1 oz)     Palliative Assessment/Data: PPS 10%   Flowsheet Rows   Flowsheet Row Most Recent Value  Intake Tab  Referral Department  Critical care  Unit at Time of Referral  ICU  Palliative Care Primary Diagnosis  Other (Comment) [liver and respiratory failure]  Date Notified  04/28/16  Palliative Care Type  New Palliative care  Reason for referral  Clarify Goals of Care, End of Life Care Assistance  Date of Admission  04/27/16  Date first seen by Palliative Care  04/29/16  # of days IP prior to Palliative referral  1  Clinical Assessment  Palliative  Performance Scale Score  10%  Psychosocial & Spiritual Assessment  Palliative Care Outcomes  Patient/Family meeting held?  Yes  Who was at the meeting?  mother Kalman Shan)  Palliative Care Outcomes  Clarified goals of care, Provided end of life care assistance, Provided psychosocial or spiritual support, Changed CPR status      Time In: 1100 Time Out: 1230 Time Total: 70mn Greater than 50%  of this time was spent counseling and coordinating care related to the above assessment and plan.  Signed by:  MIhor Dow FNP-C Palliative Medicine Team  Phone: [phone]: [phone]  Please contact Palliative Medicine Team phone at [phone] questions and concerns.  For individual provider: See AShea Evans

## 2016-04-29 NOTE — Progress Notes (Signed)
[employer] called and notified on continued low urine output. Spoke to Delta Air LinesLiz RN who will report to Dr. Nicholos Johnsamachandran.

## 2016-04-29 NOTE — Progress Notes (Signed)
MEDICATION RELATED CONSULT NOTE - INITIAL   Pharmacy Consult for Electrolyte management Indication: Liver cirrhosis/hypokalemia  No Known Allergies  Patient Measurements: Height: 6' (182.9 cm) Weight: 195 lb 8.8 oz (88.7 kg) IBW/kg (Calculated) : 77.6 Adjusted Body Weight:   Vital Signs: Temp: 98.6 F (37 C) (01/25 0800) Temp Source: Oral (01/25 0800) BP: 110/72 (01/25 0800) Pulse Rate: 103 (01/25 0800) Intake/Output from previous day: 01/24 0701 - 01/25 0700 In: 3849 [I.V.:2214; IV Piggyback:635] Out: 5597 [Urine:825; Stool:450] Intake/Output from this shift: Total I/O In: 100 [IV Piggyback:100] Out: -   Labs:  Recent Labs  04/27/16 1321 04/27/16 1423 04/27/16 2127 04/28/16 0527 04/28/16 2002 04/29/16 0333  WBC 16.5*  --   --  10.1  --  13.3*  HGB 9.6*  --   --  8.1*  --  8.2*  HCT 27.3*  --   --  23.1*  --  23.4*  PLT 135*  --   --  109*  --  109*  APTT  --  63* 66*  --   --   --   CREATININE ICTERUS AT THIS LEVEL MAY AFFECT RESULT  --  ICTERUS AT THIS LEVEL MAY AFFECT RESULT RESULTS UNAVAILABLE DUE TO INTERFERING SUBSTANCE UNABLE TO REPORT DUE TO ICTERUS UNABLE TO REPORT DUE TO ICTERUS  MG  --   --   --   --   --  1.3*  PHOS  --   --   --   --   --  UNABLE TO REPORT DUE TO ICTERUS  ALBUMIN 2.9*  --   --  2.3*  --  2.2*  PROT 8.0  --   --  6.5  --  6.4*  AST 247*  --   --  205*  --  240*  ALT 43  --   --  34  --  36  ALKPHOS 227*  --   --  194*  --  175*  BILITOT 31.3*  --   --  25.2*  --  26.3*   CrCl cannot be calculated (This lab value cannot be used to calculate CrCl because it is not a number: UNABLE TO REPORT DUE TO ICTERUS).   Microbiology: Recent Results (from the past 720 hour(s))  Culture, blood (x 2)     Status: None (Preliminary result)   Collection Time: 04/27/16  9:27 PM  Result Value Ref Range Status   Specimen Description BLOOD LEFT AC  Final   Special Requests BOTTLES DRAWN AEROBIC AND ANAEROBIC 8CC  Final   Culture NO GROWTH 2 DAYS   Final   Report Status PENDING  Incomplete  Culture, blood (x 2)     Status: None (Preliminary result)   Collection Time: 04/27/16  9:27 PM  Result Value Ref Range Status   Specimen Description BLOOD RIGHT HAND  Final   Special Requests BOTTLES DRAWN AEROBIC AND ANAEROBIC AER8CC ANA10CC  Final   Culture NO GROWTH 2 DAYS  Final   Report Status PENDING  Incomplete  MRSA PCR Screening     Status: None   Collection Time: 04/28/16  1:38 PM  Result Value Ref Range Status   MRSA by PCR NEGATIVE NEGATIVE Final    Comment:        The GeneXpert MRSA Assay (FDA approved for NASAL specimens only), is one component of a comprehensive MRSA colonization surveillance program. It is not intended to diagnose MRSA infection nor to guide or monitor treatment for MRSA infections.  Medical History: Past Medical History:  Diagnosis Date  . Anemia of chronic disease   . Anxiety   . AVNRT (AV nodal re-entry tachycardia) ([place])   . Cirrhosis ([place])   . GERD (gastroesophageal reflux disease)   . Hypercholesteremia   . Hypertension   . Liver failure ([place])   . Numbness of toes   . Pancreatitis   . Polysubstance abuse    a. former cocaine and tobacco abuse, ongoing alcohol and marijuana abuse  . Thrombocytopenia (HCC)     Medications:  Scheduled:  . chlorhexidine gluconate (MEDLINE KIT)  15 mL Mouth Rinse BID  . folic acid  1 mg Intravenous Daily  . lactulose  30 g Oral BID  . magnesium sulfate 1 - 4 g bolus IVPB  4 g Intravenous Once  . mouth rinse  15 mL Mouth Rinse QID  . multivitamin  15 mL Oral Daily  . pantoprazole (PROTONIX) IV  40 mg Intravenous Q24H  . pentoxifylline  400 mg Oral QAC breakfast  . piperacillin-tazobactam (ZOSYN)  IV  3.375 g Intravenous Q8H  . sodium chloride flush  3 mL Intravenous Q12H  . thiamine  100 mg Intravenous Daily    Assessment: Patient admitted for rectal bleeding and h/o liver cirrhosis--currently appears to be septic and is on vanc/zosyn K+  3.8 Mg -- 1.3 Phos -- unable to report Ca 6.8 -- corrected 8.2  Goal of Therapy:  K+ 3.5 - 4.5 Mg > 2.0 Phos 2.5 - 4.5 Ca 8.6 - 10.2  Plan:  Will replace magnesium with magnesium sulfate 4g IV x1. Will recheck electrolytes in the AM. Will continue to monitor output as renal function can not be determined at this time.   Thank you for this consult.  Loree Fee, PharmD 04/29/2016

## 2016-04-29 NOTE — Progress Notes (Signed)
[name] MD [address], [place], [place] [address] Phone: [phone] Fax : [phone]  Ryan Howe is being followed for acute liver failure, alcoholic hepatitis , anemia/rectal bleed   Day 3 of follow up   Subjective: Transferred yesterday to ICU for respiratory failure-Intubated - unable to communicate .    Objective: Vital signs in last 24 hours: Vitals:   04/29/16 0600 04/29/16 0700 04/29/16 0730 04/29/16 0800  BP: 112/74   110/72  Pulse: (!) 103   (!) 103  Resp: 12 13  18   Temp:    98.6 F (37 C)  TempSrc:    Oral  SpO2: 99% 98% 96% 98%  Weight:      Height:       Weight change: 3 lb 8.8 oz (1.609 kg)  Intake/Output Summary (Last 24 hours) at 04/29/16 5993 Last data filed at 04/29/16 0700  Gross per 24 hour  Intake             3506 ml  Output             1275 ml  Net             2231 ml     Exam: Heart:: Regular rate and rhythm, S1S2 present or without murmur or extra heart sounds Lungs: b/l air entry heard, decreased at bases, no additional sounds  Abdomen: distended, soft , non tender, no guarding or rigidity, Bs+ Neuro- intubated and sedated   Lab Results: CBC Latest Ref Rng & Units 04/29/2016 04/28/2016 04/27/2016  WBC 3.8 - 10.6 K/uL 13.3(H) 10.1 16.5(H)  Hemoglobin 13.0 - 18.0 g/dL 8.2(L) 8.1(L) 9.6(L)  Hematocrit 40.0 - 52.0 % 23.4(L) 23.1(L) 27.3(L)  Platelets 150 - 440 K/uL 109(L) 109(L) 135(L)    Hepatic Function Latest Ref Rng & Units 04/29/2016 04/28/2016 04/27/2016  Total Protein 6.5 - 8.1 g/dL 6.4(L) 6.5 8.0  Albumin 3.5 - 5.0 g/dL 2.2(L) 2.3(L) 2.9(L)  AST 15 - 41 U/L 240(H) 205(H) 247(H)  ALT 17 - 63 U/L 36 34 43  Alk Phosphatase 38 - 126 U/L 175(H) 194(H) 227(H)  Total Bilirubin 0.3 - 1.2 mg/dL 26.3(HH) 25.2(HH) 31.3(HH)  Bilirubin, Direct 0.1 - 0.5 mg/dL - - -    Micro Results: Recent Results (from the past 240 hour(s))  Culture, blood (x 2)     Status: None (Preliminary result)   Collection Time: 04/27/16  9:27 PM   Result Value Ref Range Status   Specimen Description BLOOD LEFT AC  Final   Special Requests BOTTLES DRAWN AEROBIC AND ANAEROBIC 8CC  Final   Culture NO GROWTH < 12 HOURS  Final   Report Status PENDING  Incomplete  Culture, blood (x 2)     Status: None (Preliminary result)   Collection Time: 04/27/16  9:27 PM  Result Value Ref Range Status   Specimen Description BLOOD RIGHT HAND  Final   Special Requests BOTTLES DRAWN AEROBIC AND ANAEROBIC AER8CC ANA10CC  Final   Culture NO GROWTH < 12 HOURS  Final   Report Status PENDING  Incomplete  MRSA PCR Screening     Status: None   Collection Time: 04/28/16  1:38 PM  Result Value Ref Range Status   MRSA by PCR NEGATIVE NEGATIVE Final    Comment:        The GeneXpert MRSA Assay (FDA approved for NASAL specimens only), is one component of a comprehensive MRSA colonization surveillance program. It is not intended to diagnose MRSA infection nor to guide or  monitor treatment for MRSA infections.    Studies/Results: Dg Chest 1 View  Result Date: 04/28/2016 CLINICAL DATA:  56 year old male intubated and line placement. Initial encounter. EXAM: CHEST 1 VIEW COMPARISON:  01/11/2016. FINDINGS: Portable AP semi upright view at 1714 hours. Intubated, endotracheal tube tip about 2.5 cm above the carina. Enteric tube in place, courses to the left upper quadrant with tip not included. Mildly lower lung volumes. Dense partial retrocardiac opacity along the medial left hemidiaphragm. Otherwise when Allowing for portable technique the lungs are clear. No pneumothorax. Stable cardiac size and mediastinal contours. IMPRESSION: 1. Endotracheal tube in good position. Enteric tube courses to the abdomen. 2. Left lower lobe atelectasis or consolidation. No other acute cardiopulmonary abnormality. Electronically Signed   By: Genevie Ann M.D.   On: 04/28/2016 17:26   Dg Abd 1 View  Result Date: 04/28/2016 CLINICAL DATA:  56 year old male intubated and line placement.  Initial encounter. EXAM: ABDOMEN - 1 VIEW COMPARISON:  Portable chest from today reported separately. Abdomen series 32817. FINDINGS: Portable AP view at 1705 hours. Enteric tube courses to the left upper quadrant in the side holes at the level of the gastric body. There is a central vascular catheter projecting over the right sacrum. Increased gas in small and large bowel loops in the mid abdomen, but the pattern remains non obstructed. Left lung base confluent opacity re- demonstrated. No acute osseous abnormality identified. IMPRESSION: 1. Enteric tube side hole up the level of the gastric body. Central vascular catheter projects over the right sacrum. 2. Increased bowel gas but nonobstructed pattern at this time. Electronically Signed   By: Genevie Ann M.D.   On: 04/28/2016 17:28   US Abdomen Limited Ruq  Result Date: 04/28/2016 CLINICAL DATA:  Cirrhosis, decompensation EXAM: US ABDOMEN LIMITED - RIGHT UPPER QUADRANT COMPARISON:  12/23/2015 FINDINGS: Gallbladder: Sludge noted within the gallbladder. There is gallbladder wall thickening measuring 5 mm. Negative sonographic Murphy's. Common bile duct: Diameter: Normal caliber, 5-6 mm. Liver: Heterogeneous, increased echotexture throughout the liver with nodular contours compatible with patient's given history of cirrhosis. No focal abnormality or biliary duct dilatation. IMPRESSION: Changes of cirrhosis.  No focal hepatic abnormality. Gallbladder sludge with gallbladder wall thickening, likely related to liver disease. Electronically Signed   By: Rolm Baptise M.D.   On: 04/28/2016 10:50   Medications: I have reviewed the patient's current medications. Scheduled Meds: . chlorhexidine gluconate (MEDLINE KIT)  15 mL Mouth Rinse BID  . folic acid  1 mg Intravenous Daily  . lactulose  300 mL Rectal TID  . magnesium sulfate 1 - 4 g bolus IVPB  4 g Intravenous Once  . mouth rinse  15 mL Mouth Rinse QID  . multivitamin  15 mL Oral Daily  . pantoprazole  (PROTONIX) IV  40 mg Intravenous Q24H  . pentoxifylline  400 mg Oral QAC breakfast  . piperacillin-tazobactam (ZOSYN)  IV  3.375 g Intravenous Q8H  . sodium chloride flush  3 mL Intravenous Q12H  . thiamine  100 mg Intravenous Daily   Continuous Infusions: . fentaNYL infusion INTRAVENOUS 200 mcg/hr (04/29/16 0700)  .  sodium bicarbonate  infusion 1000 mL 100 mL/hr at 04/29/16 0700   PRN Meds:.acetaminophen, albuterol, fentaNYL, ketorolac, midazolam, midazolam, ondansetron **OR** ondansetron (ZOFRAN) IV, oxyCODONE, sennosides   Assessment: Active Problems:   Acute liver failure without hepatic coma   Acute respiratory failure with hypoxia (HCC)  Christian Mate Tilleyis a 56 y.o.y/o malewith H/o of long standing alcohol abuse which is ongoing  admitted with jaundice, rectal bleeding which is long standing . He is presently being treated for sepsis , respiratory failure, alcohol withdrawal and oliguria.   Plan: 1. Rectal bleeding : long standing-Needs a colonoscopy when stable and he can actually tolerate a prep. Hb stable  2. Abnormal LFT's-severe alcoholic hepatitis.His discriminant function is 62 ,on [place]. We have no creatinine result- he may be in renal failure/hepatorenal syndrome may need to start on Octreotide/midiodrine based on creatinine  3. Acute liver failure: R/o portal vein thrombosis with doppler F/u Acute hepatitis panel  Overall very poor prognosis   Continue Lactulose titrated to 2 soft bowel movements daily, can add xifaxan Watch for hypoglycemia Diagnostic paracentesis to r/o SBP NG tube feedings. Low salt     LOS: 2 days   [name] 04/29/2016, 9:21 AM

## 2016-04-30 ENCOUNTER — Encounter: Payer: Self-pay | Admitting: *Deleted

## 2016-04-30 DIAGNOSIS — Z66 Do not resuscitate: Secondary | ICD-10-CM

## 2016-04-30 DIAGNOSIS — Z7189 Other specified counseling: Secondary | ICD-10-CM

## 2016-04-30 DIAGNOSIS — E7220 Disorder of urea cycle metabolism, unspecified: Secondary | ICD-10-CM

## 2016-04-30 DIAGNOSIS — Z515 Encounter for palliative care: Secondary | ICD-10-CM

## 2016-04-30 LAB — BASIC METABOLIC PANEL
ANION GAP: 5 (ref 5–15)
BUN: 27 mg/dL — AB (ref 6–20)
CHLORIDE: 105 mmol/L (ref 101–111)
CO2: 37 mmol/L — AB (ref 22–32)
Calcium: 6.5 mg/dL — ABNORMAL LOW (ref 8.9–10.3)
Creatinine, Ser: UNDETERMINED mg/dL (ref 0.61–1.24)
GLUCOSE: 223 mg/dL — AB (ref 65–99)
POTASSIUM: 3.2 mmol/L — AB (ref 3.5–5.1)
Sodium: 147 mmol/L — ABNORMAL HIGH (ref 135–145)

## 2016-04-30 LAB — MAGNESIUM: Magnesium: 2.2 mg/dL (ref 1.7–2.4)

## 2016-04-30 LAB — GLUCOSE, CAPILLARY
GLUCOSE-CAPILLARY: 118 mg/dL — AB (ref 65–99)
GLUCOSE-CAPILLARY: 109 mg/dL — AB (ref 65–99)
Glucose-Capillary: 128 mg/dL — ABNORMAL HIGH (ref 65–99)
Glucose-Capillary: 120 mg/dL — ABNORMAL HIGH (ref 65–99)

## 2016-04-30 LAB — PHOSPHORUS: Phosphorus: UNDETERMINED mg/dL (ref 2.5–4.6)

## 2016-04-30 LAB — HEPATITIS C VRS RNA DETECT BY PCR-QUAL: Hepatitis C Vrs RNA by PCR-Qual: NEGATIVE

## 2016-04-30 MED ORDER — VITAMIN B-1 100 MG PO TABS
100.0000 mg | ORAL_TABLET | Freq: Every day | ORAL | Status: DC
  Administered 2016-05-01: 100 mg via ORAL
  Filled 2016-04-30: qty 1

## 2016-04-30 MED ORDER — MORPHINE SULFATE (PF) 4 MG/ML IV SOLN: 2.0000 mg | INTRAVENOUS | Status: DC | PRN

## 2016-04-30 MED ORDER — LORAZEPAM 2 MG/ML IJ SOLN
2.0000 mg | INTRAMUSCULAR | Status: DC | PRN
  Administered 2016-04-30: 2 mg via INTRAVENOUS
  Administered 2016-05-01: 4 mg via INTRAVENOUS
  Administered 2016-05-01 (×2): 2 mg via INTRAVENOUS
  Filled 2016-04-30: qty 2
  Filled 2016-04-30 (×4): qty 1

## 2016-04-30 MED ORDER — FOLIC ACID 1 MG PO TABS
1.0000 mg | ORAL_TABLET | Freq: Every day | ORAL | Status: DC
  Administered 2016-05-01: 1 mg via ORAL
  Filled 2016-04-30: qty 1

## 2016-04-30 MED ORDER — PENTOXIFYLLINE ER 400 MG PO TBCR: 400.0000 mg | EXTENDED_RELEASE_TABLET | Freq: Every day | ORAL | 1 refills | Status: DC

## 2016-04-30 MED ORDER — DEXTROSE 5 % IV SOLN
INTRAVENOUS | Status: DC
  Administered 2016-04-30 – 2016-05-01 (×2): via INTRAVENOUS

## 2016-04-30 MED ORDER — CEFTRIAXONE SODIUM-DEXTROSE 1-3.74 GM-% IV SOLR
1.0000 g | INTRAVENOUS | Status: DC
  Administered 2016-04-30 – 2016-05-01 (×2): 1 g via INTRAVENOUS
  Filled 2016-04-30 (×3): qty 50

## 2016-04-30 MED ORDER — POTASSIUM CHLORIDE CRYS ER 20 MEQ PO TBCR
40.0000 meq | EXTENDED_RELEASE_TABLET | Freq: Once | ORAL | Status: AC
Start: 2016-04-30 — End: 2016-04-30
  Administered 2016-04-30: 40 meq via ORAL
  Filled 2016-04-30: qty 2

## 2016-04-30 MED ORDER — PANTOPRAZOLE SODIUM 40 MG PO TBEC
40.0000 mg | DELAYED_RELEASE_TABLET | Freq: Every day | ORAL | Status: DC
  Administered 2016-04-30 – 2016-05-01 (×2): 40 mg via ORAL
  Filled 2016-04-30 (×2): qty 1

## 2016-04-30 MED ORDER — ADULT MULTIVITAMIN W/MINERALS CH
1.0000 | ORAL_TABLET | Freq: Every day | ORAL | Status: DC
  Administered 2016-05-01: 1 via ORAL
  Filled 2016-04-30: qty 1

## 2016-04-30 MED ORDER — POTASSIUM CHLORIDE 20 MEQ PO PACK
40.0000 meq | PACK | Freq: Once | ORAL | Status: AC
  Administered 2016-04-30: 40 meq
  Filled 2016-04-30: qty 2

## 2016-04-30 MED ORDER — ENSURE ENLIVE PO LIQD
237.0000 mL | Freq: Two times a day (BID) | ORAL | Status: DC
  Administered 2016-04-30 – 2016-05-01 (×3): 237 mL via ORAL

## 2016-04-30 MED ORDER — DEXTROSE 5 % IV SOLN: 1.0000 g | INTRAVENOUS | Status: DC

## 2016-04-30 MED ORDER — LORAZEPAM 2 MG/ML IJ SOLN
1.0000 mg | INTRAMUSCULAR | Status: DC | PRN
  Administered 2016-04-30: 1 mg via INTRAVENOUS
  Administered 2016-04-30: 2 mg via INTRAVENOUS
  Filled 2016-04-30 (×2): qty 1

## 2016-04-30 MED ORDER — PANTOPRAZOLE SODIUM 40 MG PO PACK
40.0000 mg | PACK | Freq: Every day | ORAL | Status: DC
  Filled 2016-04-30: qty 20

## 2016-04-30 MED ORDER — CALCIUM GLUCONATE 10 % IV SOLN
1.0000 g | Freq: Once | INTRAVENOUS | Status: AC
  Administered 2016-04-30: 1 g via INTRAVENOUS
  Filled 2016-04-30: qty 10

## 2016-04-30 NOTE — Progress Notes (Signed)
MD gave order during rounds to d/c bicarb infusion and to consult speech therapy for swallowing eval if patient does not pass nursing bedside swallowing eval.

## 2016-04-30 NOTE — Progress Notes (Signed)
Patient drinking ensure at this time sitting in bed.  Mother at bedside and attentive to patient.  Alert to self and place but confused conversation during shift.  CIWA scale used to assess patient for withdrawals.  Stach per cardiac monitor with PVCs.  o2 sats low 90's on 2 L nasal cannula with no respiratory distress.  [facility]

## 2016-04-30 NOTE — Progress Notes (Signed)
Dr. Belia HemanKasa called back and gave order to cancel transfer to 1C and to make patient stepdown level of care.

## 2016-04-30 NOTE — Progress Notes (Signed)
RN made Dr. Belia HemanKasa aware that patient is breathing 7-8 breaths per minute and arouses to voice, on non-re-breather.  MD acknowledged and gave no new orders at this time.

## 2016-04-30 NOTE — Progress Notes (Addendum)
Daily Progress Note   Patient Name: Ryan Howe       Date: 04/30/2016 DOB: [birth year]  Age: 56 y.o. MRN#: [id number] Attending Physician: Flora Lipps, MD Primary Care Physician: [place] Date: 04/27/2016  Reason for Consultation/Follow-up: Establishing goals of care  Subjective: Patient self extubated this morning. Alert, oriented to person and place, and denies pain. He is drowsy but able to participate in conversation. Mother at bedside.   She is happy that he is able to "breath on his own again." Discussed with them the overall big picture of liver failure and poor prognosis. Also that he is high risk for decompensation. Discussed code status and no re-intubation. Mother appreciative of [facility] copy and reviewed last night. She states "I want him to die a natural death" and "would never want him to survive on tube feedings." I feel patient has a good sense of poor prognosis and that he is dying. He tells me he wants to go home. Discussed home hospice versus hospice facility. Mother does not think she can care for him by herself at home even with hospice services.   Mother and patient agree with residential hospice facility. Discussed the focus on comfort measures and symptom management. Comfort, quality, and dignity for each day he has left. She understands he will not receive IVF, antibiotics, or lactulose at hospice facility. I educated her on likelihood of alcohol withdrawal symptoms (now day 3 without alcohol) and these symptoms will be managed with medication. Answered questions and concerns.   Length of Stay: 3  Current Medications: Scheduled Meds:  . cefTRIAXone  1 g Intravenous Q24H  . chlorhexidine gluconate (MEDLINE KIT)  15 mL Mouth Rinse BID    . feeding supplement (ENSURE ENLIVE)  237 mL Oral BID BM  . [START ON 3/88/8280] folic acid  1 mg Oral Daily  . lactulose  30 g Oral BID  . mouth rinse  15 mL Mouth Rinse QID  . [START ON 05/01/2016] multivitamin with minerals  1 tablet Oral Daily  . pantoprazole  40 mg Oral Daily  . pentoxifylline  400 mg Oral QAC breakfast  . sodium chloride flush  3 mL Intravenous Q12H  . [START ON 05/01/2016] thiamine  100 mg Oral Daily    Continuous Infusions: . dextrose 50 mL/hr at  04/30/16 1031    PRN Meds: acetaminophen, albuterol, ketorolac, LORazepam, ondansetron **OR** ondansetron (ZOFRAN) IV, oxyCODONE, sennosides  Physical Exam  Constitutional: He is cooperative.  HENT:  Head: Normocephalic and atraumatic.  Eyes: Scleral icterus is present.  Cardiovascular: Tachycardia present.   Pulmonary/Chest: Effort normal. No tachypnea. No respiratory distress. He has decreased breath sounds.  Abdominal: He exhibits distension. Bowel sounds are decreased. There is no tenderness.  Musculoskeletal: He exhibits edema (generalized).  Neurological: He is alert.  Oriented to person and place. Drowsy  Skin: Skin is warm and dry.  jaundice  Psychiatric: He has a normal mood and affect. His behavior is normal. His speech is delayed.  Nursing note and vitals reviewed.          Vital Signs: BP 135/72   Pulse (!) 115   Temp 99.3 F (37.4 C) (Axillary)   Resp 15   Ht 6' (1.829 m)   Wt 93.5 kg (206 lb 2.1 oz)   SpO2 (!) 89%   BMI 27.96 kg/m  SpO2: SpO2: (!) 89 % O2 Device: O2 Device: Nasal Cannula O2 Flow Rate: O2 Flow Rate (L/min): 5 L/min  Intake/output summary:   Intake/Output Summary (Last 24 hours) at 04/30/16 1255 Last data filed at 04/30/16 1200  Gross per 24 hour  Intake           4223.3 ml  Output             1500 ml  Net           2723.3 ml   LBM: Last BM Date: 04/30/16 Baseline Weight: Weight: 87.1 kg (192 lb) Most recent weight: Weight: 93.5 kg (206 lb 2.1 oz)        Palliative Assessment/Data: PPS 30%   Flowsheet Rows   Flowsheet Row Most Recent Value  Intake Tab  Referral Department  Critical care  Unit at Time of Referral  ICU  Palliative Care Primary Diagnosis  Other (Comment) [liver and respiratory failure]  Date Notified  04/28/16  Palliative Care Type  New Palliative care  Reason for referral  Clarify Goals of Care, End of Life Care Assistance  Date of Admission  04/27/16  Date first seen by Palliative Care  04/29/16  # of days IP prior to Palliative referral  1  Clinical Assessment  Palliative Performance Scale Score  30%  Psychosocial & Spiritual Assessment  Palliative Care Outcomes  Patient/Family meeting held?  Yes  Who was at the meeting?  patient and mother  Palliative Care Outcomes  Clarified goals of care, Counseled regarding hospice, Provided end of life care assistance, Provided psychosocial or spiritual support, Transitioned to hospice      Patient Active Problem List   Diagnosis Date Noted  . Palliative care by specialist   . DNR (do not resuscitate)   . Goals of care, counseling/discussion   . Acute respiratory failure with hypoxia ([place])   . Blood in stool   . Dyspnea   . Generalized edema   . Alcohol abuse 01/12/2016  . Anemia of chronic disease 01/12/2016  . Hypokalemia 01/12/2016  . Thrombocytopenia ([place]) 01/12/2016  . Elevated troponin 01/12/2016  . 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 Acute CHF (congestive heart failure) ([place]) 01/12/2016  . Decompensation of cirrhosis of liver ([place])   . HTN (hypertension) 01/11/2016  . HLD (hyperlipidemia) 01/11/2016  . Anxiety 01/11/2016  . Acute liver failure without hepatic coma 12/22/2015  . Bright red blood per rectum 12/02/2015  Palliative Care Assessment & Plan   Patient Profile: 56 y.o. male  with past medical history of alcohol abuse, cirrhosis, chronic rectal bleeding, hypertension, hyperlipidemia, pancreatitis, GERD, anxiety, and  anemia admitted on 04/27/2016 with worsening rectal bleeding, abdominal distension, and jaundice. Current every day drinker-whiskey and beer. Last drink 04/27/16. In ED, patient with tachycardia and tachypnea. Leukocytosis and elevated bilirubin of 31.3. On 1/24, patient was lethargic and went into respiratory distress. He was transferred to ICU, intubated, and on vent full support. Metabolic acidosis-on bicarb gtt. Liver failure secondary to ETOH abuse-GI consulted. Patient receiving lactulose. He is not a candidate for liver transplant due to ongoing alcohol abuse. Unable to complete doppler to rule out portal vein thrombosis. Overall, patient with multiorgan failure, high risk for cardiac arrest/death, and with poor prognosis. Self extubation on 1/26 AM-patient stable on [place] but high risk for decompensation. Palliative medicine consultation to discuss goals of care/EOL.  Assessment: Acute respiratory failure secondary to metabolic acidosis Liver failure secondary to ETOH abuse Chronic rectal bleeding Liver cirrhosis Hepatic encephalopathy  Recommendations/Plan:  DNR/DNI. No re-intubation if he should decline.   Plan is residential hospice facility. Notified social work.   Mother understands shift from aggressive medical interventions to the focus on comfort and symptom management at hospice facility.  PMT not at [facility] over the weekend but will f/u next week if still hospitalized.   Code Status: DNR   Code Status Orders        Start     Ordered   04/29/16 1220  Do not attempt resuscitation (DNR)  Continuous    Question Answer Comment  In the event of cardiac or respiratory ARREST Do not call a "code blue"   In the event of cardiac or respiratory ARREST Do not perform Intubation, CPR, defibrillation or ACLS   In the event of cardiac or respiratory ARREST Use medication by any route, position, wound care, and other measures to relive pain and suffering. May use oxygen, suction and manual  treatment of airway obstruction as needed for comfort.      04/29/16 1219    Code Status History    Date Active Date Inactive Code Status Order ID Comments User Context   04/27/2016  9:16 PM 04/29/2016 12:19 PM Full Code [id number]  Shaune Pollack, MD Inpatient   01/11/2016 11:21 PM 01/13/2016  7:27 PM Full Code [id number]  Oralia Manis, MD Inpatient   12/22/2015  8:31 PM 12/26/2015  6:53 PM Full Code [id number]  Milagros Loll, MD ED   12/22/2015  4:27 PM 12/22/2015  8:30 PM Full Code [id number]  Willy Eddy, MD ED       Prognosis:   < 2 weeks Hx of liver cirrhosis secondary to ETOH abuse now in liver failure. High risk for decompensation and family agrees with focus on COMFORT if this should happen.   Discharge Planning:  Hospice facility High risk for alcohol withdrawal symptoms-last drink 04/27/16.   Care plan was discussed with patient, mother Okey Dupre), RN, Dr. Belia Heman, and SW.   Thank you for allowing the Palliative Medicine Team to assist in the care of this patient.   Time In: 1100 Time Out: 1210 Total Time Prolonged Time Billed  yes      Greater than 50%  of this time was spent counseling and coordinating care related to the above assessment and plan.  Vennie Homans, FNP-C Palliative Medicine Team  Phone: [phone] Fax: [phone]  Please contact Palliative Medicine Team phone at [phone] for questions and  concerns.

## 2016-04-30 NOTE — Progress Notes (Signed)
New referral for the hospice home on 56 yo gentleman who was [facility]18 with lethargy and respiratory distress who was intubated and transferred to the CCU with metabolic acidosis and started on bicarb gtt.  Patient has past medical history of ETOH abuse, daily drinker of whiskey and beer (last drink 1.23.18), cirrhosis, rectal bleeding, HTN, hyperlipidemia, pancreatitis, GERD, anxiety an anemia.  Patient self extubated himself this morning and is now on 5L of oxygen via [place].  He is awake and oriented to self and place, sitting up eating lunch upon my arrival to the room.   He is jaundice in color including his eyes.  He is pleasant and is able to hold a limited conversation with me due to shortness of breath.  He does state he does not want to be resuscitate and wants to go to the Hospice home for symptom management and to be comfortable.  He does not want his mother to have to take care of him.  No family is at the bedside.  Mr. Donnie Ahoilley is in agreement with transport to the hospice home today.  I called and spoke with his mother Gillie MannersRose Morgan regarding hospice home.  She states she thinks this is bed because she is unable to care for him at home.  Explained hospice home philosophy to her.  Plan for patient to transport with EMS pick up arranged for 1630.  [place]ittany, patient's RN will call [facility].  Updated information including consents faxed to the referral intake.  [facility].  Thank you for allowing participation in this patient's care.    Norris CrossKara H. Marshall, Rn Clinical Nurse [facility] [phone]

## 2016-04-30 NOTE — Discharge Summary (Signed)
DISCHARGE SUMMARY PULMONARY / CRITICAL CARE MEDICINE   Name: TALIB HEADLEY MRN: [id number] DOB: [birth year]    ADMISSION DATE:  04/27/2016 CONSULTATION DATE:  04/28/16 ADMIT DX  resp failure, liver cirrhosis  Discharge DX Liver failure resp failure Liver cirrhosis ETOH abuse Hepatic encephalopathy  REFERRING MD:  Dr. Juliene Pina  CHIEF COMPLAINT:  Respiratory Distress  HISTORY OF PRESENT ILLNESS:   Mr. Willert is a 56 y.o. Male with a known history of Chronic Rectal Bleeding, Alcohol Abuse, Cirrhosis, Liver Failure, Hypertension, Hyperlipidemia, and Pancreatitis.  He presented to [facility] ED on 04/27/16 with complaints of rectal bleeding that had worsened over the past few days from baseline, abdominal distention, and jaundice. He is a current every day drinker (at lease 8 ounces of whiskey per day and occasionally drinks 6 beers), last known drink was 04/27/16. In the ED he was found to be tachycardic, tachypneic with leukocytosis and an elevated bilirubin 31.3. He was subsequently admitted to [facility] unit for further treatment. On 04/28/16 pt noted to be in respiratory distress and lethargic he was subsequently transferred to ICU requiring mechanical intubation.  PCCM consulted 01/24 for management of acute respiratory failure and acute encephalopathy requiring mechanical intubation.     SUBJECTIVE:  Prognosis is very poor Self extubated this AM Remains critically ill [place] oxygen   HOSP COURSE Admitted to ICU, intubated 1/24, self extubated 1/26 GI consulted-meds as prescribed Palliative care team consulted-patient DNR/DNI   VITAL SIGNS: BP 139/75   Pulse (!) 110   Temp 99.3 F (37.4 C) (Axillary)   Resp 20   Ht 6' (1.829 m)   Wt 206 lb 2.1 oz (93.5 kg)   SpO2 96%   BMI 27.96 kg/m    DISCHARGE MEDS   citalopram (CELEXA) 10 MG tablet  Take 1 tablet (10 mg total) by mouth daily.   ferrous sulfate 325 (65 FE) MG tablet  Take 1 tablet (325 mg total) by mouth 2 (two) times  daily with a meal.   furosemide (LASIX) 40 MG tablet  Take 1 tablet (40 mg total) by mouth every other day.  lactulose (CHRONULAC) 10 GM/15ML solution   Take 45 mLs (30 g total) by mouth 2 (two) times daily.     metoprolol tartrate (LOPRESSOR) 25 MG tablet Take 1 tablet (25 mg total) by mouth 2 (two) times daily.   pantoprazole (PROTONIX) 40 MG tablet Take 1 tablet (40 mg total) by mouth daily.   potassium chloride 20 MEQ  Take 20 mEq by mouth daily.   spironolactone (ALDACTONE) 50 MG tablet Take 1 tablet (50 mg total) by mouth daily.      lactulose (CHRONULAC) 10 GM/15ML solution 30 g  pantoprazole (PROTONIX) EC tablet 40 mg    pentoxifylline (TRENTAL) 400 MG CR tablet  Take 1 tablet (400 mg total) by mouth daily before breakfast.  pentoxifylline (TRENTAL) CR tablet 400 mg  prednisoLONE (ORAPRED) 15 MG/5ML solution 40mg  (13.68ml)po daily for 25 days, then 30mg  (10ml) po daily for two days; then 20 (5.3ml)mg daily for two days; then 10 mg (3.31ml)po daily for two days then 5 mg (1.32ml)po daily for two days  sennosides (SENOKOT) 8.8 MG/5ML syrup 5 mL    sodium chloride flush (NS) 0.9 % injection 3 mL     PHYSICAL EXAMINATION: General: acutely ill appearing Caucasian male, lying in bed Neuro:  Lethargic, sometimes arousable HEENT:  Atraumatic, normocephalic, neck supple, scleral jaundice, no JVD Cardiovascular:  Tachycardic, RRR, s1s2, no M/R/G Lungs:  CTA b/l Abdomen:  Bowel sounds positive x4, distended, tender Musculoskeletal:  Normal bulk and tone Skin:  No rashes, lesions, ulcerations, skin jaundiced lethargic LABS:  STUDIES:  04/28/16 US Abdomen Limited>> Changes of cirrhosis.  No focal hepatic abnormality. Gallbladder sludge with gallbladder wall thickening, likely related to liver disease.  CULTURES: 04/27/16 Blood Cultures x2>>negative>>  ANTIBIOTICS: 04/28/16 Zosyn>> 04/28/16 Vancomycin>>01/24  SIGNIFICANT EVENTS: 04[facility]27/16-[facility] with  rectal bleeding, jaundice, and abdominal distention  04/28/16-Pt transferred to ICU due to acute encephalopathy and acute respiratory failure requiring mechanical intubation 1/26 self extubated   Overall, poor prognosis.  DNR/DNI. [facility]  Lucie LeatherKurian David Darby Shadwick, M.D.  [facility]  Medical Director [facility] Medical Director [facility] Card[facility]

## 2016-04-30 NOTE — Clinical Social Work Note (Signed)
CSW informed by [place]Kara with [facility] that they checked patient's insurance and [facility] can only [facility] [phone]). ADmission to [facility] [place] has been cancelled. Diannia RuderKara with Hospice has made arrangements for patient to be on waiting list at [facility]). A representative will contact the weekend CSW once they have a bed and then patient can be transferred there.  York SpanielMonica Nymir Ringler MSW,LCSW [phone]

## 2016-04-30 NOTE — Progress Notes (Signed)
RN spoke with Dr. Belia HemanKasa and made MD aware that patient's insurance will not allow him to go to hospice home in [place] and that there is a referral in for inpatient [facility] at [facility] that his insurance should cover.  RN asked if patient could be moved to 1C.  MD gave order to transfer patient to 1C with no tele monitor.

## 2016-04-30 NOTE — Progress Notes (Signed)
MEDICATION RELATED CONSULT NOTE - INITIAL   Pharmacy Consult for Electrolyte management Indication: Liver cirrhosis/hypokalemia  No Known Allergies  Patient Measurements: Height: 6' (182.9 cm) Weight: 218 lb 7.6 oz (99.1 kg) IBW/kg (Calculated) : 77.6 Adjusted Body Weight:   Vital Signs: Temp: 98.8 F (37.1 C) (01/26 0400) Temp Source: Oral (01/26 0400) BP: 138/82 (01/26 0400) Pulse Rate: 111 (01/26 0400) Intake/Output from previous day: 01/25 0701 - 01/26 0700 In: 3281.6 [I.V.:2403.3; NG/GT:728.3; IV Piggyback:150] Out: 950 [Urine:250; Stool:700] Intake/Output from this shift: Total I/O In: 1507.6 [I.V.:1083; NG/GT:374.6; IV Piggyback:50] Out: -   Labs:  Recent Labs  04/27/16 1321 04/27/16 1423 04/27/16 2127 04/28/16 0527 04/28/16 2002  04/29/16 0333 04/29/16 1246 04/29/16 1351 04/30/16 0430  WBC 16.5*  --   --  10.1  --   --  13.3*  --   --   --   HGB 9.6*  --   --  8.1*  --   --  8.2*  --   --   --   HCT 27.3*  --   --  23.1*  --   --  23.4*  --   --   --   PLT 135*  --   --  109*  --   --  109*  --   --   --   APTT  --  63* 66*  --   --   --   --   --   --   --   CREATININE ICTERUS AT THIS LEVEL MAY AFFECT RESULT  --  ICTERUS AT THIS LEVEL MAY AFFECT RESULT RESULTS UNAVAILABLE DUE TO INTERFERING SUBSTANCE UNABLE TO REPORT DUE TO ICTERUS  --  UNABLE TO REPORT DUE TO ICTERUS  --   --   UNABLE TO REPORT DUE TO ICTERUS  MG  --   --   --   --   --   < > 1.3* 2.4 2.3 2.2  PHOS  --   --   --   --   --   < > UNABLE TO REPORT DUE TO ICTERUS UNABLE TO REPORT DUE TO ICTERUS UNABLE TO REPORT DUE TO ICTERUS  UNABLE TO REPORT DUE TO ICTERUS  ALBUMIN 2.9*  --   --  2.3*  --   --  2.2*  --   --   --   PROT 8.0  --   --  6.5  --   --  6.4*  --   --   --   AST 247*  --   --  205*  --   --  240*  --   --   --   ALT 43  --   --  34  --   --  36  --   --   --   ALKPHOS 227*  --   --  194*  --   --  175*  --   --   --   BILITOT 31.3*  --   --  25.2*  --   --  26.3*  --   --   --    < > = values in this interval not displayed. CrCl cannot be calculated (This lab value cannot be used to calculate CrCl because it is not a number:  UNABLE TO REPORT DUE TO ICTERUS).   Microbiology: Recent Results (from the past 720 hour(s))  Culture, blood (x 2)     Status: None (Preliminary result)   Collection Time: 04/27/16  9:27 PM  Result Value Ref Range Status   Specimen Description BLOOD LEFT AC  Final   Special Requests BOTTLES DRAWN AEROBIC AND ANAEROBIC 8CC  Final   Culture NO GROWTH 2 DAYS  Final   Report Status PENDING  Incomplete  Culture, blood (x 2)     Status: None (Preliminary result)   Collection Time: 04/27/16  9:27 PM  Result Value Ref Range Status   Specimen Description BLOOD RIGHT HAND  Final   Special Requests BOTTLES DRAWN AEROBIC AND ANAEROBIC AER8CC ANA10CC  Final   Culture NO GROWTH 2 DAYS  Final   Report Status PENDING  Incomplete  MRSA PCR Screening     Status: None   Collection Time: 04/28/16  1:38 PM  Result Value Ref Range Status   MRSA by PCR NEGATIVE NEGATIVE Final    Comment:        The GeneXpert MRSA Assay (FDA approved for NASAL specimens only), is one component of a comprehensive MRSA colonization surveillance program. It is not intended to diagnose MRSA infection nor to guide or monitor treatment for MRSA infections.     Medical History: Past Medical History:  Diagnosis Date  . Anemia of chronic disease   . Anxiety   . AVNRT (AV nodal re-entry tachycardia) ([place])   . Cirrhosis ([place])   . GERD (gastroesophageal reflux disease)   . Hypercholesteremia   . Hypertension   . Liver failure ([place])   . Numbness of toes   . Pancreatitis   . Polysubstance abuse    a. former cocaine and tobacco abuse, ongoing alcohol and marijuana abuse  . Thrombocytopenia (HCC)     Medications:  Scheduled:  . chlorhexidine gluconate (MEDLINE KIT)  15 mL Mouth Rinse BID  . folic acid  1 mg Intravenous Daily  . lactulose  30 g Oral BID  . mouth  rinse  15 mL Mouth Rinse QID  . multivitamin  15 mL Oral Daily  . pantoprazole (PROTONIX) IV  40 mg Intravenous Q24H  . pentoxifylline  400 mg Oral QAC breakfast  . piperacillin-tazobactam (ZOSYN)  IV  3.375 g Intravenous Q8H  . potassium chloride  40 mEq Per Tube Once  . sodium chloride flush  3 mL Intravenous Q12H  . thiamine  100 mg Intravenous Daily    Assessment: Patient admitted for rectal bleeding and h/o liver cirrhosis--currently appears to be septic and is on vanc/zosyn K+ 3.8 Mg -- 1.3 Phos -- unable to report Ca 6.8 -- corrected 8.2  Goal of Therapy:  K+ 3.5 - 4.5 Mg > 2.0 Phos 2.5 - 4.5 Ca 8.6 - 10.2  Plan:  Will replace magnesium with magnesium sulfate 4g IV x1. Will recheck electrolytes in the AM. Will continue to monitor output as renal function can not be determined at this time.   0126 AM K+ 3.2., Ca 6.5 corrected 7.9. 40 mEq KCl per tube x1 ordered. 1 gram calcium gluconate IV ordered. F/u BMP tomorrow AM.  Thank you for this consult.  Daray Polgar S, PharmD 04/30/2016

## 2016-04-30 NOTE — Progress Notes (Signed)
PULMONARY / CRITICAL CARE MEDICINE   Name: Ryan Howe MRN: [id number] DOB: [birth year]    ADMISSION DATE:  04/27/2016 CONSULTATION DATE:  04/28/16  REFERRING MD:  Dr. Juliene PinaMody  CHIEF COMPLAINT:  Respiratory Distress  HISTORY OF PRESENT ILLNESS:   Ryan Howe is a 56 y.o. Male with a known history of Chronic Rectal Bleeding, Alcohol Abuse, Cirrhosis, Liver Failure, Hypertension, Hyperlipidemia, and Pancreatitis.  [facility] on 04/27/16 with complaints of rectal bleeding that had worsened over the past few days from baseline, abdominal distention, and jaundice. He is a current every day drinker (at lease 8 ounces of whiskey per day and occasionally drinks 6 beers), last known drink was 04/27/16. In the ED he was found to be tachycardic, tachypneic with leukocytosis and an elevated bilirubin 31.3. [facility] for further treatment. On 04/28/16 pt noted to be in respiratory distress and lethargic he was subsequently transferred to ICU requiring mechanical intubation.  PCCM consulted 01/24 for management of acute respiratory failure and acute encephalopathy requiring mechanical intubation.    REVIEW OF SYSTEMS:   Unable to obtain due to respiratory distress and lethargy  SUBJECTIVE:  Unable to obtain due to respiratory distress Prognosis is very poor Self extubated this AM Remains critically ill On 100% NRB mask  VITAL SIGNS: BP 124/74   Pulse (!) 116   Temp 99.3 F (37.4 C) (Axillary)   Resp 15   Ht 6' (1.829 m)   Wt 93.5 kg (206 lb 2.1 oz)   SpO2 99%   BMI 27.96 kg/m   HEMODYNAMICS:    VENTILATOR SETTINGS: Vent Mode: PRVC FiO2 (%):  [40 %-100 %] 100 % Set Rate:  [15 bmp] 15 bmp Vt Set:  [500 mL] 500 mL PEEP:  [5 cmH20] 5 cmH20 Plateau Pressure:  [18 cmH20] 18 cmH20  INTAKE / OUTPUT: I/O last 3 completed shifts: In: 5871.6 [I.V.:3843.3; Other:1000; NG/GT:728.3; IV Piggyback:300] Out: 2150 [Urine:900; Stool:1250]  PHYSICAL  EXAMINATION: General: acutely ill appearing Caucasian male, lying in bed Neuro:  Lethargic, sometimes arousable HEENT:  Atraumatic, normocephalic, neck supple, scleral jaundice, no JVD Cardiovascular:  Tachycardic, RRR, s1s2, no M/R/G Lungs:  CTA b/l Abdomen:  Bowel sounds positive x4, distended, tender Musculoskeletal:  Normal bulk and tone Skin:  No rashes, lesions, ulcerations, skin jaundiced lethargic LABS:  BMET  Recent Labs Lab 04/28/16 2002 04/29/16 0333 04/30/16 0430  NA 143 145 147*  K 4.2 3.8 3.2*  CL 113* 112* 105  CO2 20* 25 37*  BUN 12 17 27*  CREATININE UNABLE TO REPORT DUE TO ICTERUS UNABLE TO REPORT DUE TO ICTERUS  UNABLE TO REPORT DUE TO ICTERUS  GLUCOSE 174* 172* 223*    Electrolytes  Recent Labs Lab 04/28/16 2002  04/29/16 0333 04/29/16 1246 04/29/16 1351 04/30/16 0430  CALCIUM 7.0*  --  6.8*  --   --  6.5*  MG  --   < > 1.3* 2.4 2.3 2.2  PHOS  --   < > UNABLE TO REPORT DUE TO ICTERUS UNABLE TO REPORT DUE TO ICTERUS UNABLE TO REPORT DUE TO ICTERUS  UNABLE TO REPORT DUE TO ICTERUS  < > = values in this interval not displayed.  CBC  Recent Labs Lab 04/27/16 1321 04/28/16 0527 04/29/16 0333  WBC 16.5* 10.1 13.3*  HGB 9.6* 8.1* 8.2*  HCT 27.3* 23.1* 23.4*  PLT 135* 109* 109*    Coag's  Recent Labs Lab 04/27/16 1423 04/27/16 2127 04/28/16 1030  APTT 63* 66*  --   INR 1.70 1.73 1.59    Sepsis Markers  Recent Labs Lab 04/27/16 1423 04/27/16 2047 04/27/16 2127  LATICACIDVEN 2.5* 1.8  --   PROCALCITON  --   --  0.18    ABG  Recent Labs Lab 04/28/16 1430 04/28/16 2000  PHART 7.19* 7.38  PCO2ART 42 33  PO2ART 107 PENDING    Liver Enzymes  Recent Labs Lab 04/27/16 1321 04/28/16 0527 04/29/16 0333  AST 247* 205* 240*  ALT 43 34 36  ALKPHOS 227* 194* 175*  BILITOT 31.3* 25.2* 26.3*  ALBUMIN 2.9* 2.3* 2.2*    Cardiac Enzymes No results for input(s): TROPONINI, PROBNP in the last 168 hours.  Glucose  Recent  Labs Lab 04/28/16 1325 04/29/16 1853 04/29/16 1958 04/30/16 0001 04/30/16 0415 04/30/16 0725  GLUCAP 70 150* 144* 142* 118* 109*    Imaging No results found. STUDIES:  04/28/16 US Abdomen Limited>> Changes of cirrhosis.  No focal hepatic abnormality. Gallbladder sludge with gallbladder wall thickening, likely related to liver disease.  CULTURES: 04/27/16 Blood Cultures x2>>negative>>  ANTIBIOTICS: 04/28/16 Zosyn>> 04/28/16 Vancomycin>>01/24  SIGNIFICANT EVENTS: 04/27/16-Pt admitted to [facility] unit with rectal bleeding, jaundice, and abdominal distention  04/28/16-Pt transferred to ICU due to acute encephalopathy and acute respiratory failure requiring mechanical intubation  LINES/TUBES: 04/28/16 #8 ETT>> 04/28/16 Right Femoral CVL>>  ASSESSMENT / PLAN:  PULMONARY A:   Acute Respiratory Failure secondary to metabolic acidosis  P:   Self extubated On 100% NRB mask  CARDIOVASCULAR A:  SinusTachycardia  Hx: AV nodal reentry tachycardia, HTN, and Hypercholesteremia  P:  Continuous telemetry monitoring Hold outpatient metoprolol and lasix for now   RENAL A:   Metabolic Acidosis P:   Monitor I&O Repeat BMP today  Sodium Bicarb gtt Replace electrolytes as indicated   GASTROINTESTINAL A:   Liver failure secondary to ETOH abuse Hx: Chronic rectal bleeding, Liver cirrhosis, and Pancreatitis P:  Lactulose oral vs rectal Continue protonix for PUD prophylaxis Follow AST/ALT's GI following, appreciate input  HEMATOLOGIC A:   Anemia secondary to acute on chronic rectal bleeding Thrombocytopenia P:  No chemical VTE prophylaxis SCD's only  Monitor for s/sx of bleeding Trend CBC's Transfuse for Hgb <7 or for symptomatic anemia  Monitor PT/INR   INFECTIOUS A:   Leukocytosis>>Resolving P:   Trend WBC's and monitor curve  Trend PCT's Continue abx as listed above  Follow blood cultures  ENDOCRINE A:   No acute issues  P:   Follow serum glucose   Hyper/Hypoglycemic protocol  NEUROLOGIC A:   Hepatic Encephalopathy At risk for ETOH withdrawal secondary to ETOH abuse hx  Hx: Polysubstance abuse and Anxiety  P:  Lethargic but arousbale High risk for re-intubation  I have personally obtained a history, examined the patient, evaluated Pertinent laboratory and RadioGraphic/imaging results, and  formulated the assessment and plan   The Patient requires high complexity decision making for assessment and support, frequent evaluation and titration of therapies, application of advanced monitoring technologies and extensive interpretation of multiple databases. Critical Care Time devoted to patient care services described in this note is 35 minutes.   Overall, patient is critically ill, prognosis is guarded.  Patient with Multiorgan failure and at high risk for cardiac arrest and death.   Palliative care consult, patient now DNR status, will need to address DNI status Overall poor prognosis  Wallis Bamberg Santiago Glad, M.D.  [facility]  Medical Director [facility] Medical Director [facility] Cardio-Pulmonary Department

## 2016-04-30 NOTE — Progress Notes (Addendum)
MEDICATION RELATED CONSULT NOTE   Pharmacy Consult for electrolyte monitoring   Pharmacy consulted for electrolyte management for 56 yo male critical care patient s/p extubation for acute liver failure. Patient receiving lactulose 30g PO BID.   Plan:   1. Electrolytes: replaced potassium PO x 1 and calcium gluconate 1g IV x 1. Will order additional potassium PO x 1 at 1800. Will recheck electrolytes with am labs.    2. Zosyn: patient previously on Zosyn x 2 days, patient transitioned to ceftriaxone 1g IV Q24hr for remainder of 7 day antibiotic course.   No Known Allergies  Patient Measurements: Height: 6' (182.9 cm) Weight: 206 lb 2.1 oz (93.5 kg) IBW/kg (Calculated) : 77.6  Vital Signs: Temp: 99.3 F (37.4 C) (01/26 0730) Temp Source: Axillary (01/26 0730) BP: 135/72 (01/26 1200) Pulse Rate: 115 (01/26 1200) Intake/Output from previous day: 01/25 0701 - 01/26 0700 In: 3631.6 [I.V.:2703.3; NG/GT:728.3; IV Piggyback:200] Out: 1500 [Urine:700; Stool:800] Intake/Output from this shift: Total I/O In: 691.7 [P.O.:240; I.V.:451.7] Out: -   Labs:  Recent Labs  04/27/16 1423  04/27/16 2127 04/28/16 0527 04/28/16 2002  04/29/16 0333 04/29/16 1246 04/29/16 1351 04/30/16 0430  WBC  --   --   --  10.1  --   --  13.3*  --   --   --   HGB  --   --   --  8.1*  --   --  8.2*  --   --   --   HCT  --   --   --  23.1*  --   --  23.4*  --   --   --   PLT  --   --   --  109*  --   --  109*  --   --   --   APTT 63*  --  66*  --   --   --   --   --   --   --   CREATININE  --   < > ICTERUS AT THIS LEVEL MAY AFFECT RESULT RESULTS UNAVAILABLE DUE TO INTERFERING SUBSTANCE UNABLE TO REPORT DUE TO ICTERUS  --  UNABLE TO REPORT DUE TO ICTERUS  --   --   UNABLE TO REPORT DUE TO ICTERUS  MG  --   --   --   --   --   < > 1.3* 2.4 2.3 2.2  PHOS  --   --   --   --   --   < > UNABLE TO REPORT DUE TO ICTERUS UNABLE TO REPORT DUE TO ICTERUS UNABLE TO REPORT DUE TO ICTERUS  UNABLE TO REPORT  DUE TO ICTERUS  ALBUMIN  --   --   --  2.3*  --   --  2.2*  --   --   --   PROT  --   --   --  6.5  --   --  6.4*  --   --   --   AST  --   --   --  205*  --   --  240*  --   --   --   ALT  --   --   --  34  --   --  36  --   --   --   ALKPHOS  --   --   --  194*  --   --  175*  --   --   --  BILITOT  --   --   --  25.2*  --   --  26.3*  --   --   --   < > = values in this interval not displayed. CrCl cannot be calculated (This lab value cannot be used to calculate CrCl because it is not a number:  UNABLE TO REPORT DUE TO ICTERUS).   Microbiology: Recent Results (from the past 720 hour(s))  Culture, blood (x 2)     Status: None (Preliminary result)   Collection Time: 04/27/16  9:27 PM  Result Value Ref Range Status   Specimen Description BLOOD LEFT AC  Final   Special Requests BOTTLES DRAWN AEROBIC AND ANAEROBIC 8CC  Final   Culture NO GROWTH 3 DAYS  Final   Report Status PENDING  Incomplete  Culture, blood (x 2)     Status: None (Preliminary result)   Collection Time: 04/27/16  9:27 PM  Result Value Ref Range Status   Specimen Description BLOOD RIGHT HAND  Final   Special Requests BOTTLES DRAWN AEROBIC AND ANAEROBIC AER8CC ANA10CC  Final   Culture NO GROWTH 3 DAYS  Final   Report Status PENDING  Incomplete  MRSA PCR Screening     Status: None   Collection Time: 04/28/16  1:38 PM  Result Value Ref Range Status   MRSA by PCR NEGATIVE NEGATIVE Final    Comment:        The GeneXpert MRSA Assay (FDA approved for NASAL specimens only), is one component of a comprehensive MRSA colonization surveillance program. It is not intended to diagnose MRSA infection nor to guide or monitor treatment for MRSA infections.     Pharmacy will continue to monitor and adjust per consult.   Eston Heslin,Tuvia L 04/30/2016,1:37 PM

## 2016-04-30 NOTE — Progress Notes (Addendum)
At 0650 pt. Self extubated. Placed on non-rebreather. O2 sat at 98%. Non-labored breathing. Vitals stable. Fentanyl stopped. Patient is alert and oriented. Respiratory notified. MD notified.

## 2016-04-30 NOTE — Progress Notes (Signed)
Chaplain was making rounds and visited with pt in room Ic5. Pt was in good spirits. When Chaplain asked if there was any spiritual concerns to discuss pt responded no but just pray for me. Provided the ministry of prayer and a pastoral presence.    04/30/16 1030  Clinical Encounter Type  Visited With Patient  Visit Type Initial;Spiritual support  Referral From Nurse  Spiritual Encounters  Spiritual Needs Prayer

## 2016-04-30 NOTE — Clinical Social Work Note (Signed)
Palliative Care is following patient and decision was made by patient to pursue hospice home. Kara with [facility] has made these arrangements. CSW prepared packet. Patient expected to transport via EMS to hospice home today. York SpanielMonica Jazzmin Newbold MSW,LCSW [phone]

## 2016-04-30 NOTE — Progress Notes (Signed)
Ryan Bellows MD [address] [place], [place] [address] Phone: [phone] Fax : [phone]  Ryan Howe is being followed for acute liver failure from alcoholic hepatitis  Day 4 of follow up   Subjective: Awake but not orientated, off ventilator, no pain.    Objective: Vital signs in last 24 hours: Vitals:   04/30/16 0700 04/30/16 0713 04/30/16 0730 04/30/16 0800  BP: 136/79  124/74 128/80  Pulse: (!) 110 (!) 111 (!) 116 (!) 109  Resp: 17 13 15  (!) 8  Temp:   99.3 F (37.4 C)   TempSrc:   Axillary   SpO2: 92% 98% 99% 97%  Weight:      Height:       Weight change: 10 lb 9.3 oz (4.8 kg)  Intake/Output Summary (Last 24 hours) at 04/30/16 0930 Last data filed at 04/30/16 0800  Gross per 24 hour  Intake          3731.63 ml  Output             1500 ml  Net          2231.63 ml     Exam: Appears deeply jaundiced  Heart:: Regular rate and rhythm, S1S2 present or without murmur or extra heart sounds Lungs: normal, clear to auscultation and clear to auscultation and percussion Abdomen: distended, soft no tenderness , no guarding or rigidity , Bs+ Neuro- alert but not orientated   Lab Results: CBC Latest Ref Rng & Units 04/29/2016 04/28/2016 04/27/2016  WBC 3.8 - 10.6 K/uL 13.3(H) 10.1 16.5(H)  Hemoglobin 13.0 - 18.0 g/dL 8.2(L) 8.1(L) 9.6(L)  Hematocrit 40.0 - 52.0 % 23.4(L) 23.1(L) 27.3(L)  Platelets 150 - 440 K/uL 109(L) 109(L) 135(L)    Hepatic Function Latest Ref Rng & Units 04/29/2016 04/28/2016 04/27/2016  Total Protein 6.5 - 8.1 g/dL 6.4(L) 6.5 8.0  Albumin 3.5 - 5.0 g/dL 2.2(L) 2.3(L) 2.9(L)  AST 15 - 41 U/L 240(H) 205(H) 247(H)  ALT 17 - 63 U/L 36 34 43  Alk Phosphatase 38 - 126 U/L 175(H) 194(H) 227(H)  Total Bilirubin 0.3 - 1.2 mg/dL 26.3(HH) 25.2(HH) 31.3(HH)  Bilirubin, Direct 0.1 - 0.5 mg/dL - - -    Micro Results: Recent Results (from the past 240 hour(s))  Culture, blood (x 2)     Status: None (Preliminary result)   Collection Time:  04/27/16  9:27 PM  Result Value Ref Range Status   Specimen Description BLOOD LEFT AC  Final   Special Requests BOTTLES DRAWN AEROBIC AND ANAEROBIC 8CC  Final   Culture NO GROWTH 3 DAYS  Final   Report Status PENDING  Incomplete  Culture, blood (x 2)     Status: None (Preliminary result)   Collection Time: 04/27/16  9:27 PM  Result Value Ref Range Status   Specimen Description BLOOD RIGHT HAND  Final   Special Requests BOTTLES DRAWN AEROBIC AND ANAEROBIC AER8CC ANA10CC  Final   Culture NO GROWTH 3 DAYS  Final   Report Status PENDING  Incomplete  MRSA PCR Screening     Status: None   Collection Time: 04/28/16  1:38 PM  Result Value Ref Range Status   MRSA by PCR NEGATIVE NEGATIVE Final    Comment:        The GeneXpert MRSA Assay (FDA approved for NASAL specimens only), is one component of a comprehensive MRSA colonization surveillance program. It is not intended to diagnose MRSA infection nor to guide or monitor treatment for MRSA infections.  Studies/Results: Dg Chest 1 View  Result Date: 04/28/2016 CLINICAL DATA:  56 year old male intubated and line placement. Initial encounter. EXAM: CHEST 1 VIEW COMPARISON:  01/11/2016. FINDINGS: Portable AP semi upright view at 1714 hours. Intubated, endotracheal tube tip about 2.5 cm above the carina. Enteric tube in place, courses to the left upper quadrant with tip not included. Mildly lower lung volumes. Dense partial retrocardiac opacity along the medial left hemidiaphragm. Otherwise when Allowing for portable technique the lungs are clear. No pneumothorax. Stable cardiac size and mediastinal contours. IMPRESSION: 1. Endotracheal tube in good position. Enteric tube courses to the abdomen. 2. Left lower lobe atelectasis or consolidation. No other acute cardiopulmonary abnormality. Electronically Signed   By: Genevie Ann M.D.   On: 04/28/2016 17:26   Dg Abd 1 View  Result Date: 04/28/2016 CLINICAL DATA:  56 year old male intubated and line  placement. Initial encounter. EXAM: ABDOMEN - 1 VIEW COMPARISON:  Portable chest from today reported separately. Abdomen series 32817. FINDINGS: Portable AP view at 1705 hours. Enteric tube courses to the left upper quadrant in the side holes at the level of the gastric body. There is a central vascular catheter projecting over the right sacrum. Increased gas in small and large bowel loops in the mid abdomen, but the pattern remains non obstructed. Left lung base confluent opacity re- demonstrated. No acute osseous abnormality identified. IMPRESSION: 1. Enteric tube side hole up the level of the gastric body. Central vascular catheter projects over the right sacrum. 2. Increased bowel gas but nonobstructed pattern at this time. Electronically Signed   By: Genevie Ann M.D.   On: 04/28/2016 17:28   US Abdomen Limited Ruq  Result Date: 04/28/2016 CLINICAL DATA:  Cirrhosis, decompensation EXAM: US ABDOMEN LIMITED - RIGHT UPPER QUADRANT COMPARISON:  12/23/2015 FINDINGS: Gallbladder: Sludge noted within the gallbladder. There is gallbladder wall thickening measuring 5 mm. Negative sonographic Murphy's. Common bile duct: Diameter: Normal caliber, 5-6 mm. Liver: Heterogeneous, increased echotexture throughout the liver with nodular contours compatible with patient's given history of cirrhosis. No focal abnormality or biliary duct dilatation. IMPRESSION: Changes of cirrhosis.  No focal hepatic abnormality. Gallbladder sludge with gallbladder wall thickening, likely related to liver disease. Electronically Signed   By: Rolm Baptise M.D.   On: 04/28/2016 10:50   Medications: I have reviewed the patient's current medications. Scheduled Meds: . chlorhexidine gluconate (MEDLINE KIT)  15 mL Mouth Rinse BID  . folic acid  1 mg Intravenous Daily  . lactulose  30 g Oral BID  . mouth rinse  15 mL Mouth Rinse QID  . multivitamin  15 mL Oral Daily  . pantoprazole (PROTONIX) IV  40 mg Intravenous Q24H  . pentoxifylline  400 mg  Oral QAC breakfast  . piperacillin-tazobactam (ZOSYN)  IV  3.375 g Intravenous Q8H  . sodium chloride flush  3 mL Intravenous Q12H  . thiamine  100 mg Intravenous Daily   Continuous Infusions: . feeding supplement (VITAL AF 1.2 CAL) 1,000 mL (04/30/16 0430)  . fentaNYL infusion INTRAVENOUS 200 mcg/hr (04/30/16 0410)  .  sodium bicarbonate  infusion 1000 mL 100 mL/hr at 04/30/16 0400   PRN Meds:.acetaminophen, albuterol, fentaNYL, ketorolac, midazolam, midazolam, ondansetron **OR** ondansetron (ZOFRAN) IV, oxyCODONE, sennosides   Assessment: Active Problems:   Acute liver failure without hepatic coma   Acute respiratory failure with hypoxia [facility])   Palliative care by specialist   DNR (do not resuscitate)   Goals of care, counseling/discussion   Ryan Goens Tilleyis a 56 y.o.y/o malewith H/o  of long standing alcohol abuse which is ongoing admitted with jaundice, rectal bleeding which is long standing . He is presently being treated for sepsis , respiratory failure, DNR presently  Plan: 1. Abnormal LFT's-severe alcoholic hepatitis with liver failure .His discriminant function is 62 ,on [place]. We have no creatinine result- he may be in renal failure/hepatorenal syndrome may need to start on Octreotide/midiodrine based on creatinine/urine out put R/o portal vein thrombosis with doppler Overall very poor prognosis  NG tube feedings if not taking in orally. Low salt    2. Hepatic encephelopathy- correct electrolytes, lactulose, add xifaxin when taking in orally or when has NG tube Diagnostic paracentesis to r/o SBP    LOS: 3 days   Ryan Howe 04/30/2016, 9:30 AM

## 2016-04-30 NOTE — Progress Notes (Signed)
Follow up on new referral for the hospice home.  Patient's insurance is [employer]/[facility] and they will not approve for the patient to come to our inpatient hospice and [facility]  I have spoken to the team at [facility] and [facility]  I spoke with Herbert SetaHeather, in triage, and she states they currently have a short waitlist.  [facility] only does [facility].  Patient is appropriate and requires symptom management for dyspnea, currently requiring morphine and ativan.  Patient will also likely need management of agitation due to his ETOH abuse and he has been without ETOH x 3 days.  Monica Blue AshMara SW and [place] RN in CCU notified.  Plan will be for the patient to be moved out of the CCU to comfort care.  [facility] felt like [facility] would be able to likely offer a bed.  Thank you for allowing participation in this patient's care and we apologize for not being able to take the patient.  Patient notified of situation and declines paying out of pocket for hospital bed.

## 2016-05-01 LAB — CBC
HEMATOCRIT: 21.9 % — AB (ref 40.0–52.0)
HEMOGLOBIN: 7.6 g/dL — AB (ref 13.0–18.0)
MCH: 33.7 pg (ref 26.0–34.0)
MCHC: 34.6 g/dL (ref 32.0–36.0)
MCV: 97.4 fL (ref 80.0–100.0)
Platelets: 126 10*3/uL — ABNORMAL LOW (ref 150–440)
RBC: 2.25 MIL/uL — ABNORMAL LOW (ref 4.40–5.90)
RDW: 22.6 % — ABNORMAL HIGH (ref 11.5–14.5)
WBC: 11.7 10*3/uL — ABNORMAL HIGH (ref 3.8–10.6)

## 2016-05-01 LAB — COMPREHENSIVE METABOLIC PANEL
ALBUMIN: 2.1 g/dL — AB (ref 3.5–5.0)
ALT: 43 U/L (ref 17–63)
AST: 176 U/L — AB (ref 15–41)
Alkaline Phosphatase: 129 U/L — ABNORMAL HIGH (ref 38–126)
Anion gap: 6 (ref 5–15)
BILIRUBIN TOTAL: 29.3 mg/dL — AB (ref 0.3–1.2)
BUN: 25 mg/dL — AB (ref 6–20)
CO2: 33 mmol/L — ABNORMAL HIGH (ref 22–32)
CREATININE: UNDETERMINED mg/dL (ref 0.61–1.24)
Calcium: 7.1 mg/dL — ABNORMAL LOW (ref 8.9–10.3)
Chloride: 105 mmol/L (ref 101–111)
GLUCOSE: 101 mg/dL — AB (ref 65–99)
Potassium: 3 mmol/L — ABNORMAL LOW (ref 3.5–5.1)
Sodium: 144 mmol/L (ref 135–145)
TOTAL PROTEIN: 6.3 g/dL — AB (ref 6.5–8.1)

## 2016-05-01 LAB — GLUCOSE, CAPILLARY
GLUCOSE-CAPILLARY: 140 mg/dL — AB (ref 65–99)
Glucose-Capillary: 113 mg/dL — ABNORMAL HIGH (ref 65–99)
Glucose-Capillary: 159 mg/dL — ABNORMAL HIGH (ref 65–99)
Glucose-Capillary: 94 mg/dL (ref 65–99)

## 2016-05-01 LAB — PHOSPHORUS: Phosphorus: UNDETERMINED mg/dL (ref 2.5–4.6)

## 2016-05-01 LAB — MAGNESIUM: Magnesium: 2 mg/dL (ref 1.7–2.4)

## 2016-05-01 MED ORDER — POTASSIUM CHLORIDE CRYS ER 20 MEQ PO TBCR
40.0000 meq | EXTENDED_RELEASE_TABLET | Freq: Once | ORAL | Status: AC
  Administered 2016-05-01: 40 meq via ORAL
  Filled 2016-05-01: qty 2

## 2016-05-01 NOTE — Progress Notes (Signed)
[facility] [place] [facility]   PATIENT NAME: Ryan BirchwoodMichael Prindle    MR#:  [id number]  DATE OF BIRTH:  04/24/1960  SUBJECTIVE:  Patient confused  REVIEW OF SYSTEMS:    Review of Systems  Unable to perform ROS: Critical illness    Tolerating Diet:yes      DRUG ALLERGIES:  No Known Allergies  VITALS:  Blood pressure 140/84, pulse (!) 112, temperature 98.8 F (37.1 C), temperature source Axillary, resp. rate 20, height 6' (1.829 m), weight 95.3 kg (210 lb 1.6 oz), SpO2 92 %.  PHYSICAL EXAMINATION:   Physical Exam  Constitutional: He is oriented to person, place, and time. No distress.  Ill appearing  HENT:  Head: Normocephalic.  Eyes: Scleral icterus is present.  Neck: Neck supple. No JVD present. No tracheal deviation present.  Cardiovascular: Normal rate, regular rhythm and normal heart sounds.  Exam reveals no gallop and no friction rub.   No murmur heard. Pulmonary/Chest: Effort normal and breath sounds normal. No respiratory distress. He has no wheezes. He has no rales. He exhibits no tenderness.  Abdominal: He exhibits distension. He exhibits no mass. There is no tenderness. There is no rebound and no guarding.  Musculoskeletal: Normal range of motion. He exhibits no edema.  Neurological: He is alert and oriented to person, place, and time.  confused  Skin: Skin is warm. No rash noted. No erythema.  jaundice      LABORATORY PANEL:   CBC  Recent Labs Lab 05/01/16 0448  WBC 11.7*  HGB 7.6*  HCT 21.9*  PLT 126*   ------------------------------------------------------------------------------------------------------------------  Chemistries   Recent Labs Lab 05/01/16 0448  NA 144  K 3.0*  CL 105  CO2 33*  GLUCOSE 101*  BUN 25*  CREATININE UNABLE TO REPORT DUE TO ICTERUS  CALCIUM 7.1*  MG 2.0  AST 176*  ALT 43  ALKPHOS 129*  BILITOT 29.3*    ------------------------------------------------------------------------------------------------------------------  Cardiac Enzymes No results for input(s): TROPONINI in the last 168 hours. ------------------------------------------------------------------------------------------------------------------  RADIOLOGY:  No results found.   ASSESSMENT AND PLAN:   56 year old male with history of liver cirrhosis and long-standing EtOH abuse who is admitted for severe alcoholic hepatitis  1. Severe alcoholic hepatitis: Patient has been referred to inpatient hospice Awaiting bed  2. EtOH abuse  3. Rectal bleeding: Chronic  4. Acute respiratory distress status post intubation and self extubated 1/26  Continue current orders. Await hospice bed. No labs for a.m.        Management plans discussed with Dr Belia HemanKasa  CODE STATUS: DNR  TOTAL TIME TAKING CARE OF THIS PATIENT: 22 minutes.     POSSIBLE D/C 1-2 days, DEPENDING ON CLINICAL CONDITION.   Danen Lapaglia M.D on 05/01/2016 at 12:14 PM  Between 7am to 6pm - Pager - [phone] After 6pm go to [email or web address] - Social research officer, governmentpassword EPAS [facility]  [facility] [place] [facility]  Office  [phone]  CC: Primary care physician; [facility]  Note: This dictation was prepared with Dragon dictation along with smaller phrase technology. Any transcriptional errors that result from this process are unintentional.

## 2016-05-01 NOTE — Progress Notes (Signed)
Pt transferring to room 144 on unit 1A. Pt and his mother are aware and agreeable to transfer. Receiving RN Lawson Fiscal(Lori, RN) has been given report. Assessment is unchanged from this morning and receiving RN has been given report. Belongings sent with pt at bedside.

## 2016-05-01 NOTE — Progress Notes (Signed)
MEDICATION RELATED CONSULT NOTE   Pharmacy Consult for electrolyte monitoring   Pharmacy consulted for electrolyte management for 56 yo male critical care patient s/p extubation for acute liver failure. Patient receiving lactulose 30g PO BID.   Plan:   1. Electrolytes: replaced potassium PO x 1. Will recheck electrolytes with am labs.    2. Zosyn: patient previously on Zosyn x 2 days, patient transitioned to ceftriaxone 1g IV Q24hr for remainder of 7 day antibiotic course.   No Known Allergies  Patient Measurements: Height: 6' (182.9 cm) Weight: 210 lb 1.6 oz (95.3 kg) IBW/kg (Calculated) : 77.6  Vital Signs: Temp: 98.5 F (36.9 C) (01/27 0935) Temp Source: Axillary (01/27 0935) BP: 137/86 (01/27 0900) Pulse Rate: 110 (01/27 0900) Intake/Output from previous day: 01/26 0701 - 01/27 0700 In: 1561.7 [P.O.:960; I.V.:551.7; IV Piggyback:50] Out: 1610 [Urine:1475; Stool:1000] Intake/Output from this shift: No intake/output data recorded.  Labs:  Recent Labs  04/29/16 0333  04/29/16 1351 04/30/16 0430 05/01/16 0448  WBC 13.3*  --   --   --  11.7*  HGB 8.2*  --   --   --  7.6*  HCT 23.4*  --   --   --  21.9*  PLT 109*  --   --   --  126*  CREATININE UNABLE TO REPORT DUE TO ICTERUS  --   --   UNABLE TO REPORT DUE TO ICTERUS UNABLE TO REPORT DUE TO ICTERUS  MG 1.3*  < > 2.3 2.2 2.0  PHOS UNABLE TO REPORT DUE TO ICTERUS  < > UNABLE TO REPORT DUE TO ICTERUS  UNABLE TO REPORT DUE TO ICTERUS UNABLE TO REPORT DUE TO ICTERUS  ALBUMIN 2.2*  --   --   --  2.1*  PROT 6.4*  --   --   --  6.3*  AST 240*  --   --   --  176*  ALT 36  --   --   --  43  ALKPHOS 175*  --   --   --  129*  BILITOT 26.3*  --   --   --  29.3*  < > = values in this interval not displayed. CrCl cannot be calculated (This lab value cannot be used to calculate CrCl because it is not a number: UNABLE TO REPORT DUE TO ICTERUS).   Microbiology: Recent Results (from the past 720 hour(s))  Culture, blood (x  2)     Status: None (Preliminary result)   Collection Time: 04/27/16  9:27 PM  Result Value Ref Range Status   Specimen Description BLOOD LEFT AC  Final   Special Requests BOTTLES DRAWN AEROBIC AND ANAEROBIC 8CC  Final   Culture NO GROWTH 3 DAYS  Final   Report Status PENDING  Incomplete  Culture, blood (x 2)     Status: None (Preliminary result)   Collection Time: 04/27/16  9:27 PM  Result Value Ref Range Status   Specimen Description BLOOD RIGHT HAND  Final   Special Requests BOTTLES DRAWN AEROBIC AND ANAEROBIC AER8CC ANA10CC  Final   Culture NO GROWTH 3 DAYS  Final   Report Status PENDING  Incomplete  MRSA PCR Screening     Status: None   Collection Time: 04/28/16  1:38 PM  Result Value Ref Range Status   MRSA by PCR NEGATIVE NEGATIVE Final    Comment:        The GeneXpert MRSA Assay (FDA approved for NASAL specimens only), is one component of a  comprehensive MRSA colonization surveillance program. It is not intended to diagnose MRSA infection nor to guide or monitor treatment for MRSA infections.     Pharmacy will continue to monitor and adjust per consult.   Stormy CardKatsoudas,Joanny Dupree K , [facility]vine[facility]PH Clinical Pharmacist 05/01/2016,10:21 AM

## 2016-05-01 NOTE — Progress Notes (Signed)
Pt has been very agitationed this shift since his transfer down to ortho. Not staying in bed. Sitter ordered. Continues on Iv agbvt, refused to keep oxygen on. Has tele sitter d

## 2016-05-01 NOTE — Progress Notes (Addendum)
Bed alarm heard and I went to check on pt. By the time I got into the room both of pt's knees were touching the floor with his torso and head leaning forward over the bed. Staff called for assistance and pt was helped back into the bed with help of 4 staff members (including charge nurse). He was able to stand up and sit on bed with minimal staff assistance. Pt denies any pain. When asked why he was getting out of the bed he states he was looking for his drink. Drink was at bedside within reach as well as call bell at time of fall. Pt remains confused, but I reoriented him to room, reminded him to use his call ball, and handed him his drink. Pam, RN (today's charge nurse) placed a personal alarm on patient in addition to the bed alarm. His mother was at the bedside watching pt earlier this morning, but left without telling staff that she would need to leave. Dr. Juliene PinaMody called and aware of fall. Dr. Juliene PinaMody does not want to get any additional imaging or tests at this time. Pt's mother Gillie Manners(Rose Morgan) called and notified of fall. She states she plans on being back to the floor within the next hour to sit with him. I have ordered tele-sitter for patient. I explained what the tele-sitter is and its role in safety to pt's mother and she is agreeable to us utilizing tele-sitter.   Update: When pt's mother returned to floor she reported that she told a staff member that she was leaving.

## 2016-05-01 NOTE — Progress Notes (Signed)
Ryan Howe is being followed for acute liver failure from alcoholic hepatitis Day 5 of follow up   Subjective:  Awake, talking, but not making sense Patient's mother decided to pursue inpatient hospice   Objective: Vital signs in last 24 hours: Vitals:   05/01/16 0950 05/01/16 1000 05/01/16 1100 05/01/16 1126  BP:  (!) 141/118 140/84   Pulse:  (!) 116 (!) 112   Resp:  (!) 24 20   Temp:    98.8 F (37.1 C)  TempSrc:    Axillary  SpO2:  98% 92%   Weight: 95.3 kg (210 lb 1.6 oz)     Height:       Weight change:   Intake/Output Summary (Last 24 hours) at 05/01/16 1148 Last data filed at 05/01/16 1116  Gross per 24 hour  Intake              800 ml  Output             2825 ml  Net            -2025 ml     Exam: Appears deeply jaundiced  Heart:: Regular rate and rhythm Lungs: no respiratory distress Abdomen: distended, soft Neuro: alert but not orientated   Lab Results: CBC Latest Ref Rng & Units 05/01/2016 04/29/2016 04/28/2016  WBC 3.8 - 10.6 K/uL 11.7(H) 13.3(H) 10.1  Hemoglobin 13.0 - 18.0 g/dL 7.6(L) 8.2(L) 8.1(L)  Hematocrit 40.0 - 52.0 % 21.9(L) 23.4(L) 23.1(L)  Platelets 150 - 440 K/uL 126(L) 109(L) 109(L)    Hepatic Function Latest Ref Rng & Units 05/01/2016 04/29/2016 04/28/2016  Total Protein 6.5 - 8.1 g/dL 6.3(L) 6.4(L) 6.5  Albumin 3.5 - 5.0 g/dL 2.1(L) 2.2(L) 2.3(L)  AST 15 - 41 U/L 176(H) 240(H) 205(H)  ALT 17 - 63 U/L 43 36 34  Alk Phosphatase 38 - 126 U/L 129(H) 175(H) 194(H)  Total Bilirubin 0.3 - 1.2 mg/dL 29.3(HH) 26.3(HH) 25.2(HH)  Bilirubin, Direct 0.1 - 0.5 mg/dL - - -    Micro Results: Recent Results (from the past 240 hour(s))  Culture, blood (x 2)     Status: None (Preliminary result)   Collection Time: 04/27/16  9:27 PM  Result Value Ref Range Status   Specimen Description BLOOD LEFT AC  Final   Special Requests BOTTLES DRAWN AEROBIC AND ANAEROBIC 8CC  Final   Culture NO GROWTH 3 DAYS  Final   Report Status PENDING  Incomplete    Culture, blood (x 2)     Status: None (Preliminary result)   Collection Time: 04/27/16  9:27 PM  Result Value Ref Range Status   Specimen Description BLOOD RIGHT HAND  Final   Special Requests BOTTLES DRAWN AEROBIC AND ANAEROBIC AER8CC ANA10CC  Final   Culture NO GROWTH 3 DAYS  Final   Report Status PENDING  Incomplete  MRSA PCR Screening     Status: None   Collection Time: 04/28/16  1:38 PM  Result Value Ref Range Status   MRSA by PCR NEGATIVE NEGATIVE Final    Comment:        The GeneXpert MRSA Assay (FDA approved for NASAL specimens only), is one component of a comprehensive MRSA colonization surveillance program. It is not intended to diagnose MRSA infection nor to guide or monitor treatment for MRSA infections.    Studies/Results: No results found. Medications: I have reviewed the patient's current medications. Scheduled Meds: . cefTRIAXone  1 g Intravenous Q24H  . chlorhexidine gluconate (MEDLINE KIT)  15 mL Mouth  Rinse BID  . feeding supplement (ENSURE ENLIVE)  237 mL Oral BID BM  . folic acid  1 mg Oral Daily  . lactulose  30 g Oral BID  . mouth rinse  15 mL Mouth Rinse QID  . multivitamin with minerals  1 tablet Oral Daily  . pantoprazole  40 mg Oral Daily  . sodium chloride flush  3 mL Intravenous Q12H  . thiamine  100 mg Oral Daily   Continuous Infusions: . dextrose Stopped (04/30/16 1900)   PRN Meds:.acetaminophen, albuterol, ketorolac, LORazepam, LORazepam, morphine injection, ondansetron **OR** ondansetron (ZOFRAN) IV, oxyCODONE, sennosides   Assessment: Active Problems:   Acute liver failure without hepatic coma   Acute respiratory failure with hypoxia ([place])   Palliative care by specialist   DNR (do not resuscitate)   Goals of care, counseling/discussion   Hyperammonemia ([place])   Encounter for hospice care discussion   Ryan Howe a 56 y.o.y/o malewith history notable for long standing alcohol abuse who was admitted for severe alcoholic  hepatitis and is now made comfort measures. I echoed the appropriateness of this decision with the patient's mother. The patient's mother did not have any further questions.   Plan: 1. Severe alcoholic hepatitis  - inpatient hospice as decided by the patient's mother - GI will sign off, please feel free to call back with any questions    Ryan Howe 05/01/2016, 11:48 AM

## 2016-05-01 NOTE — Clinical Social Work Note (Signed)
[facility] contacted CSW to discuss patient. Bed not currently available, but patient is on waitlist. [facility].  Argentina PonderKaren Martha Teige Rountree, MSW [employer]CSWA [phone]

## 2016-05-01 NOTE — Progress Notes (Addendum)
Foley catheter removed. Pt DTV by [id number].   Update: 1132 Pt has voided on his own

## 2016-05-01 NOTE — Progress Notes (Addendum)
Pt has an excoriated spot on his scrotum that is continuously oozing blood. Dr. Juliene PinaMody notified. Will place pressure dressing and reassess shortly.   Update 1054: Spot on scrotum continues to ooze, but bleeding is slowing. Dr. Belia HemanKasa notified. Will continue with pressure dressing and order wound consult.   Update 1230: Spot on scrotum has stopped bleeding.

## 2016-05-02 LAB — GLUCOSE, CAPILLARY
GLUCOSE-CAPILLARY: 102 mg/dL — AB (ref 65–99)
GLUCOSE-CAPILLARY: 81 mg/dL (ref 65–99)
GLUCOSE-CAPILLARY: 97 mg/dL (ref 65–99)
Glucose-Capillary: 98 mg/dL (ref 65–99)
Glucose-Capillary: 100 mg/dL — ABNORMAL HIGH (ref 65–99)

## 2016-05-02 LAB — CULTURE, BLOOD (ROUTINE X 2)
CULTURE: NO GROWTH
CULTURE: NO GROWTH

## 2016-05-02 LAB — BLOOD GAS, ARTERIAL
ACID-BASE DEFICIT: 5.1 mmol/L — AB (ref 0.0–2.0)
Bicarbonate: 19.5 mmol/L — ABNORMAL LOW (ref 20.0–28.0)
FIO2: 0.7
MECHANICAL RATE: 15
MECHVT: 500 mL
PEEP: 5 cmH2O
Patient temperature: 37
pCO2 arterial: 33 mmHg (ref 32.0–48.0)
pH, Arterial: 7.38 (ref 7.350–7.450)

## 2016-05-02 LAB — BASIC METABOLIC PANEL
Anion gap: 9 (ref 5–15)
BUN: 18 mg/dL (ref 6–20)
CALCIUM: 7.4 mg/dL — AB (ref 8.9–10.3)
CO2: 29 mmol/L (ref 22–32)
Chloride: 103 mmol/L (ref 101–111)
GLUCOSE: 105 mg/dL — AB (ref 65–99)
Potassium: 2.8 mmol/L — ABNORMAL LOW (ref 3.5–5.1)
Sodium: 141 mmol/L (ref 135–145)

## 2016-05-02 LAB — MAGNESIUM: Magnesium: 1.9 mg/dL (ref 1.7–2.4)

## 2016-05-02 LAB — PHOSPHORUS

## 2016-05-02 LAB — POTASSIUM
POTASSIUM: 2.9 mmol/L — AB (ref 3.5–5.1)
Potassium: 2.8 mmol/L — ABNORMAL LOW (ref 3.5–5.1)

## 2016-05-02 MED ORDER — POTASSIUM CHLORIDE 20 MEQ PO PACK
40.0000 meq | PACK | Freq: Once | ORAL | Status: AC
  Administered 2016-05-02: 40 meq via ORAL
  Filled 2016-05-02: qty 2

## 2016-05-02 MED ORDER — BACITRACIN ZINC 500 UNIT/GM EX OINT
TOPICAL_OINTMENT | Freq: Two times a day (BID) | CUTANEOUS | Status: DC
  Administered 2016-05-02 – 2016-05-04 (×5): via TOPICAL
  Filled 2016-05-02 (×9): qty 15

## 2016-05-02 MED ORDER — SODIUM CHLORIDE 0.9 % IV SOLN
30.0000 meq | Freq: Once | INTRAVENOUS | Status: AC
  Administered 2016-05-02: 30 meq via INTRAVENOUS
  Filled 2016-05-02: qty 15

## 2016-05-02 MED ORDER — POTASSIUM CHLORIDE 20 MEQ PO PACK
40.0000 meq | PACK | Freq: Two times a day (BID) | ORAL | Status: DC
  Administered 2016-05-02 – 2016-05-03 (×3): 40 meq via ORAL
  Filled 2016-05-02 (×3): qty 2

## 2016-05-02 NOTE — Progress Notes (Signed)
MEDICATION RELATED CONSULT NOTE   Pharmacy Consult for electrolyte monitoring   Pharmacy consulted for electrolyte management for 56 yo male critical care patient s/p extubation for acute liver failure. Patient receiving lactulose 30g PO BID.   Plan:   1/28 AM K = 2.8.  MD ordered KCl 30meq IV x 1 dose and KCl 40meq packet PO BID.  Will recheck K level today at 12:00.    1/28 12:16 K = 2.9. Ordered another bag of KCl 30meq IV.  Will recheck K level tonight at 19:00.   Will recheck electrolytes with am labs.    No Known Allergies  Patient Measurements: Height: 6' (182.9 cm) Weight: 200 lb (90.7 kg) IBW/kg (Calculated) : 77.6  Vital Signs: Temp: 97.8 F (36.6 C) (01/28 1216) Temp Source: Axillary (01/28 1216) BP: 110/62 (01/28 1216) Pulse Rate: 103 (01/28 1216) Intake/Output from previous day: 01/27 0701 - 01/28 0700 In: 1238.3 [P.O.:240; I.V.:683.3; IV Piggyback:315] Out: 600 [Urine:600] Intake/Output from this shift: Total I/O In: 120 [P.O.:120] Out: 200 [Urine:200]  Labs:  Recent Labs  04/30/16 0430 05/01/16 0448 05/02/16 0314  WBC  --  11.7*  --   HGB  --  7.6*  --   HCT  --  21.9*  --   PLT  --  126*  --   CREATININE  UNABLE TO REPORT DUE TO ICTERUS UNABLE TO REPORT DUE TO ICTERUS SEE COMMENTS  MG 2.2 2.0 1.9  PHOS  UNABLE TO REPORT DUE TO ICTERUS UNABLE TO REPORT DUE TO ICTERUS SEE COMMENTS  ALBUMIN  --  2.1*  --   PROT  --  6.3*  --   AST  --  176*  --   ALT  --  43  --   ALKPHOS  --  129*  --   BILITOT  --  29.3*  --    CrCl cannot be calculated (This lab value cannot be used to calculate CrCl because it is not a number: SEE COMMENTS).   Pharmacy will continue to monitor and adjust per consult.   Stormy CardKatsoudas,Vallen Calabrese K , [facility] CenterRPH Clinical Pharmacist 05/02/2016,1:52 PM

## 2016-05-02 NOTE — Progress Notes (Signed)
MEDICATION RELATED CONSULT NOTE   Pharmacy Consult for electrolyte monitoring   Pharmacy consulted for electrolyte management for 56 yo male critical care patient s/p extubation for acute liver failure. Patient receiving lactulose 30g PO BID.   Plan:   1/28 AM K = 2.8.  MD ordered KCl 30meq IV x 1 dose and KCl 40meq packet PO BID.  Will recheck K level today at 12:00.    Will recheck electrolytes with am labs.    No Known Allergies  Patient Measurements: Height: 6' (182.9 cm) Weight: 200 lb (90.7 kg) IBW/kg (Calculated) : 77.6  Vital Signs: Temp: 99.5 F (37.5 C) (01/28 0534) Temp Source: Oral (01/28 0534) BP: 122/65 (01/28 0534) Pulse Rate: 106 (01/28 0534) Intake/Output from previous day: 01/27 0701 - 01/28 0700 In: 1238.3 [P.O.:240; I.V.:683.3; IV Piggyback:315] Out: 600 [Urine:600] Intake/Output from this shift: Total I/O In: 120 [P.O.:120] Out: -   Labs:  Recent Labs  04/30/16 0430 05/01/16 0448 05/02/16 0314  WBC  --  11.7*  --   HGB  --  7.6*  --   HCT  --  21.9*  --   PLT  --  126*  --   CREATININE  UNABLE TO REPORT DUE TO ICTERUS UNABLE TO REPORT DUE TO ICTERUS SEE COMMENTS  MG 2.2 2.0 1.9  PHOS  UNABLE TO REPORT DUE TO ICTERUS UNABLE TO REPORT DUE TO ICTERUS SEE COMMENTS  ALBUMIN  --  2.1*  --   PROT  --  6.3*  --   AST  --  176*  --   ALT  --  43  --   ALKPHOS  --  129*  --   BILITOT  --  29.3*  --    CrCl cannot be calculated (This lab value cannot be used to calculate CrCl because it is not a number: SEE COMMENTS).   Pharmacy will continue to monitor and adjust per consult.   Stormy CardKatsoudas,Rhoda Waldvogel K , [facility] Omak AscRPH Clinical Pharmacist 05/02/2016,10:18 AM

## 2016-05-02 NOTE — Consult Note (Signed)
WOC Nurse wound consult note Reason for Consult: bleeding from scrotum, IAD, scabbed area on forehead Wound type:Moisture associated skin damage (MASD), specifically incontinence associated dermatitis (IAD).  Traumatic injury to forehead Pressure Injury POA: No Measurement:Forehead with 1cm round area with dried serum (scab) obscuring depth. Buttocks, periarea and scrotum with incontinence associated dermatitis.  Patient is wearing a diaper. No bleeding noted at this time Wound bed:As described above Drainage (amount, consistency, odor) None Periwound:intact Dressing procedure/placement/frequency: I have provided Nurisng with guidance for the care of the two areas noted above by way of Orders. Our house skin care products to the perineal area with adult containment briefs left open on the sides will resolve the IAD. Bacitracin is ordered for the forehead lesion. WOC nursing team will not follow, but will remain available to this patient, the nursing and medical teams.  Please re-consult if needed. Thanks, Ladona MowLaurie Bernetha Anschutz, MSN, RN, GNP, Hans EdenCWOCN, CWON-AP, FAAN  Pager# [phone]

## 2016-05-02 NOTE — Progress Notes (Signed)
MD notified of pt K+ of 2.8 this am. Sheryle Hailiamond MD to put in new orders

## 2016-05-02 NOTE — Progress Notes (Signed)
MEDICATION RELATED CONSULT NOTE   Pharmacy Consult for electrolyte monitoring   Pharmacy consulted for electrolyte management for 56 yo male critical care patient s/p extubation for acute liver failure. Patient receiving lactulose 30g PO BID.   Plan:   1/28 AM K = 2.8.  MD ordered KCl 30meq IV x 1 dose and KCl 40meq packet PO BID.  Will recheck K level today at 12:00.    1/28 12:16 K = 2.9. Ordered another bag of KCl 30meq IV.  Will recheck K level tonight at 19:00.   1/28  1853 K= 2.8. Will order KCL 30mEq IV x 1 dose and KCL 40 mEq po x1. Patient also has KCL 40 BID daily ordered. Will recheck K+ and Mg with AM labs   Will recheck electrolytes with am labs.    No Known Allergies  Patient Measurements: Height: 6' (182.9 cm) Weight: 200 lb (90.7 kg) IBW/kg (Calculated) : 77.6  Vital Signs: Temp: 98.9 F (37.2 C) (01/28 1928) Temp Source: Oral (01/28 1928) BP: 103/80 (01/28 1928) Pulse Rate: 103 (01/28 1928) Intake/Output from previous day: 01/27 0701 - 01/28 0700 In: 1238.3 [P.O.:240; I.V.:683.3; IV Piggyback:315] Out: 600 [Urine:600] Intake/Output from this shift: No intake/output data recorded.  Labs:  Recent Labs  04/30/16 0430 05/01/16 0448 05/02/16 0314  WBC  --  11.7*  --   HGB  --  7.6*  --   HCT  --  21.9*  --   PLT  --  126*  --   CREATININE  UNABLE TO REPORT DUE TO ICTERUS UNABLE TO REPORT DUE TO ICTERUS SEE COMMENTS  MG 2.2 2.0 1.9  PHOS  UNABLE TO REPORT DUE TO ICTERUS UNABLE TO REPORT DUE TO ICTERUS SEE COMMENTS  ALBUMIN  --  2.1*  --   PROT  --  6.3*  --   AST  --  176*  --   ALT  --  43  --   ALKPHOS  --  129*  --   BILITOT  --  29.3*  --    CrCl cannot be calculated (This lab value cannot be used to calculate CrCl because it is not a number: SEE COMMENTS).   Pharmacy will continue to monitor and adjust per consult.   Gardner CandleSheema M Noni Stonesifer , [facility] SystemRPH Clinical Pharmacist 05/02/2016,7:57 PM

## 2016-05-02 NOTE — Progress Notes (Signed)
Patient with bleeding abrasion to scrotum, applied telfa, and foam dressing.

## 2016-05-02 NOTE — Progress Notes (Signed)
[employer] [facility] [facility] [place] [facility]   PATIENT NAME: Ryan BirchwoodMichael Howe    MR#:  [id number]  DATE OF BIRTH:  1961/02/12  SUBJECTIVE:  Patient Remains encephalopathic. Sitter at bedside   REVIEW OF SYSTEMS:    Review of Systems  Unable to perform ROS: Critical illness    Tolerating Diet:yes      DRUG ALLERGIES:  No Known Allergies  VITALS:  Blood pressure 122/65, pulse (!) 106, temperature 99.5 F (37.5 C), temperature source Oral, resp. rate 19, height 6' (1.829 m), weight 90.7 kg (200 lb), SpO2 96 %.  PHYSICAL EXAMINATION:   Physical Exam  Constitutional: No distress.  Ill appearing  HENT:  Head: Normocephalic.  Eyes: Scleral icterus is present.  Neck: Neck supple. No JVD present. No tracheal deviation present.  Cardiovascular: Normal rate, regular rhythm and normal heart sounds.  Exam reveals no gallop and no friction rub.   No murmur heard. Pulmonary/Chest: Effort normal and breath sounds normal. No respiratory distress. He has no wheezes. He has no rales. He exhibits no tenderness.  Abdominal: He exhibits distension. He exhibits no mass. There is no tenderness. There is no rebound and no guarding.  Musculoskeletal: Normal range of motion. He exhibits no edema.  Neurological: He is alert.  confused  Skin: Skin is warm. No rash noted. No erythema.  jaundice      LABORATORY PANEL:   CBC  Recent Labs Lab 05/01/16 0448  WBC 11.7*  HGB 7.6*  HCT 21.9*  PLT 126*   ------------------------------------------------------------------------------------------------------------------  Chemistries   Recent Labs Lab 05/01/16 0448 05/02/16 0314  NA 144 141  K 3.0* 2.8*  CL 105 103  CO2 33* 29  GLUCOSE 101* 105*  BUN 25* 18  CREATININE UNABLE TO REPORT DUE TO ICTERUS SEE COMMENTS  CALCIUM 7.1* 7.4*  MG 2.0 1.9  AST 176*  --   ALT 43  --   ALKPHOS 129*  --   BILITOT 29.3*  --     ------------------------------------------------------------------------------------------------------------------  Cardiac Enzymes No results for input(s): TROPONINI in the last 168 hours. ------------------------------------------------------------------------------------------------------------------  RADIOLOGY:  No results found.   ASSESSMENT AND PLAN:   56 year old male with history of liver cirrhosis and long-standing EtOH abuse who is admitted for severe alcoholic hepatitis  1. Severe alcoholic hepatitis: Patient has been [facility] 2. EtOH abuse  3. Rectal bleeding: Chronic  4. Acute respiratory distress status post intubation and self extubated 1/26 5. Electrolyte abnormalities Continue current orders. Await hospice bed. No labs for a.m.          CODE STATUS: DNR  TOTAL TIME TAKING CARE OF THIS PATIENT: 10 minutes.     POSSIBLE D/C 1-2 days, DEPENDING ON [facility]   Taye Cato M.D on 05/02/2016 at 10:20 AM  Between 7am to 6pm - Pager - [phone] After 6pm go to [email or web address] - Social research officer, governmentpassword EPAS [facility]  [facility] [place] [facility]  Office  [phone]  CC: Primary care physician; [facility]  Note: This dictation was prepared with Dragon dictation along with smaller phrase technology. Any transcriptional errors that result from this process are unintentional.

## 2016-05-03 LAB — PHOSPHORUS

## 2016-05-03 LAB — BASIC METABOLIC PANEL
Anion gap: 6 (ref 5–15)
BUN: 18 mg/dL (ref 6–20)
CALCIUM: 7.2 mg/dL — AB (ref 8.9–10.3)
CO2: 26 mmol/L (ref 22–32)
Chloride: 111 mmol/L (ref 101–111)
Glucose, Bld: 97 mg/dL (ref 65–99)
Potassium: 3.4 mmol/L — ABNORMAL LOW (ref 3.5–5.1)
SODIUM: 143 mmol/L (ref 135–145)

## 2016-05-03 LAB — GLUCOSE, CAPILLARY: Glucose-Capillary: 89 mg/dL (ref 65–99)

## 2016-05-03 LAB — MAGNESIUM: Magnesium: 1.8 mg/dL (ref 1.7–2.4)

## 2016-05-03 MED ORDER — LACTULOSE 10 GM/15ML PO SOLN: 10.0000 g | Freq: Two times a day (BID) | ORAL | Status: DC

## 2016-05-03 MED ORDER — MORPHINE SULFATE (PF) 2 MG/ML IV SOLN: 1.0000 mg | INTRAVENOUS | Status: DC | PRN

## 2016-05-03 MED ORDER — MORPHINE SULFATE (PF) 2 MG/ML IV SOLN
1.0000 mg | INTRAVENOUS | Status: DC | PRN
  Administered 2016-05-03 – 2016-05-04 (×3): 1 mg via INTRAVENOUS
  Filled 2016-05-03 (×3): qty 1

## 2016-05-03 MED ORDER — CALCIUM GLUCONATE 10 % IV SOLN
2.0000 g | Freq: Once | INTRAVENOUS | Status: DC
  Filled 2016-05-03: qty 20

## 2016-05-03 NOTE — Progress Notes (Signed)
CH visited the Pt again at 3:41pm, gathered 2 witnesses and a notary public and completed the Ad. A copy of AD was placed in the Pt's file.    05/03/16 1600  Clinical Encounter Type  Visited With Patient;Patient and family together  Visit Type Follow-up;Spiritual support;Other (Comment)  Referral From Chaplain  Consult/Referral To Chaplain  Spiritual Encounters  Spiritual Needs Other (Comment)  Stress Factors  Patient Stress Factors Health changes  Family Stress Factors Major life changes  Advance Directives (For Healthcare)  Does Patient Have a Medical Advance Directive? Yes  Does patient want to make changes to medical advance directive? Yes (Inpatient - patient requests chaplain consult to change a medical advance directive)  Type of Advance Directive Healthcare Power of [place];Living will  Copy of Healthcare Power of Attorney in Chart? Yes  Copy of Living Will in Chart? Yes  Would patient like information on creating a medical advance directive? Yes (Inpatient - patient requests chaplain consult to create a medical advance directive)

## 2016-05-03 NOTE — Progress Notes (Signed)
[employer] - [place] [facility]   PATIENT NAME: Ryan Howe    MR#:  [id number]  DATE OF BIRTH:  13-Sep-1960  SUBJECTIVE:  Ryan Howe is more awake. Mother at bedside  REVIEW OF SYSTEMS:    Review of Systems  Unable to perform ROS: Critical illness   DRUG ALLERGIES:  No Known Allergies  VITALS:  Blood pressure (!) 104/56, pulse (!) 104, temperature 99.5 F (37.5 C), temperature source Oral, resp. rate 18, height 6' (1.829 m), weight 94.2 kg (207 lb 11.2 oz), SpO2 96 %.  PHYSICAL EXAMINATION:   Physical Exam  Constitutional: No distress.  Ill appearing  HENT:  Head: Normocephalic.  Eyes: Scleral icterus is present.  Neck: Neck supple. No JVD present. No tracheal deviation present.  Cardiovascular: Normal rate, regular rhythm and normal heart sounds.  Exam reveals no gallop and no friction rub.   No murmur heard. Pulmonary/Chest: Effort normal and breath sounds normal. No respiratory distress. Ryan Howe has no wheezes. Ryan Howe has no rales. Ryan Howe exhibits no tenderness.  Abdominal: Ryan Howe exhibits distension. Ryan Howe exhibits no mass. There is no tenderness. There is no rebound and no guarding.  Musculoskeletal: Normal range of motion. Ryan Howe exhibits no edema.  Neurological: Ryan Howe is alert.  confused  Skin: Skin is warm. No rash noted. No erythema.  jaundice   LABORATORY PANEL:   CBC  Recent Labs Lab 05/01/16 0448  WBC 11.7*  HGB 7.6*  HCT 21.9*  PLT 126*   ------------------------------------------------------------------------------------------------------------------  Chemistries   Recent Labs Lab 05/01/16 0448  05/03/16 0324  NA 144  < > 143  K 3.0*  < > 3.4*  CL 105  < > 111  CO2 33*  < > 26  GLUCOSE 101*  < > 97  BUN 25*  < > 18  CREATININE UNABLE TO REPORT DUE TO ICTERUS  < > SEE COMMENTS  CALCIUM 7.1*  < > 7.2*  MG 2.0  < > 1.8  AST 176*  --   --   ALT 43  --   --   ALKPHOS 129*  --   --   BILITOT 29.3*  --   --   < > = values in this interval not  displayed. ------------------------------------------------------------------------------------------------------------------  Cardiac Enzymes No results for input(s): TROPONINI in the last 168 hours. ------------------------------------------------------------------------------------------------------------------  RADIOLOGY:  No results found.   ASSESSMENT AND PLAN:   56 year old male with history of liver cirrhosis and long-standing EtOH abuse who is admitted for severe alcoholic hepatitis  1. Severe alcoholic hepatitis: Patient has been referred to inpatient hospice [facility] unable to accept him.  2. EtOH abuse  3. Rectal bleeding: Chronic  4. Acute respiratory distress status post intubation and self extubated 1/26  5. Electrolyte abnormalities No need to check repeat labs  CODE STATUS: DNR  TOTAL TIME TAKING CARE OF THIS PATIENT:20 minutes.   POSSIBLE D/C 1-2 days, DEPENDING ON [facility]  Milagros LollSudini, Izabella Marcantel R M.D on 05/03/2016 at 2:06 PM  Between 7am to 6pm - Pager - [phone]  After 6pm go to [email or web address] - Social research officer, governmentpassword EPAS [facility]  [facility] [place] [facility]  Office  [phone]  CC: Primary care physician; [facility]  Note: This dictation was prepared with Dragon dictation along with smaller phrase technology. Any transcriptional errors that result from this process are unintentional.

## 2016-05-03 NOTE — Progress Notes (Signed)
SNF Benefits & Hospice:   SNF: Number called: [phone] Rep: Wendelyn Breslow Reference Number: [id number]  In-network Deductible: $400, met for calendar year Out-of-Network Deductible: $1600, $0 met  In-Network Out of Pocket Max: $800, [phone] met towards amount ($182 remaining) Out-of-Network Out of Pocket Max: $3200, $0 met  In-Network SNF: After deductible met, responsible for a 30% co-insurance for SNF services until out of pocket met.  Limited to 60 days in/out of network combined.  All days available.    Out-of-Network SNF: After $1600 deductible met, 60% co-insurance for SNF services until out of pocket met.  Limited to 60 days in/out of network combined.  All days available.  Auth required for SNF at [id number].  Confirmed with rep that at all [facility] (including [facility]) are considered out of network.     Hospice: Number called: [phone] Rep: Jenne Pane  Per rep, hospice facility would need to initiate authorization to determine if they can be considered at an in-network level.  Requested information: clinical, diagnosis, hospice facility's NPI, and demographic information.  Instructed that facility can call in to initiate auth at [phone] or fax information to [phone].

## 2016-05-03 NOTE — Progress Notes (Signed)
CH responded to an OR for Advanced Directives. CH visited the Pt, who was with his mother and Nurse Aid bedside. CH educated the Pt on Ad, but Pt appeared to be in pain or discomfort that he was not paying attention to the Ch. CH left the AD brochure with the Pt and instructed Pt to fill out the brochure and [facility] the [facility] when [facility] is ready to complete it. Pt was experiencing discomfort but agreed to do what the [facility] suggested.     05/03/16 1400  Clinical Encounter Type  Visited With Patient;Family  Visit Type Initial;Follow-up;Other (Comment)  Referral From Nurse  Consult/Referral To Chaplain  Spiritual Encounters  Spiritual Needs Brochure

## 2016-05-03 NOTE — Progress Notes (Signed)
Advance care planning  Met with patient and mother in his room.  He is more awake. No pain. Is visibly SOB on 5 L O2. Hasnt been asking morphine. At IV morphine as needed for shortness of breath, anxiety or pain. Advised mother at bedside to ask for morphine. Discussed with nursing staff.  Stop patient's lactulose and potassium. No further labs or new IV lines.  Discussed with social work and Merchandiser, retail.  Due to cost is unable to accept patient. We will need to look at other facilities. Poor prognosis and patient will benefit from hospice home. Mother unable to care for patient at home even with hospice services.  Time spent 20 minutes

## 2016-05-03 NOTE — Progress Notes (Signed)
Per RN patient is not getting IV ativan or morphine and is not in DT's. Clinical Child psychotherapistocial Worker (CSW) contacted Research scientist (physical sciences)[facility] and made her aware of above. Per Herbert SetaHeather patient does not meet criteria for their hospice, which is GIP only so they cannot accept him. CSW contacted Alanson PulsKaren [place] liaison to see if there is any way they can work something out with patient. CSW will continue to follow and assist as needed.  Baker Hughes IncorporatedBailey Tkeyah Burkman, LCSW [phone]

## 2016-05-03 NOTE — Progress Notes (Addendum)
Per [facility] liaison they are working on authorization for their hospice home and have submitted for it. Clinical Social Worker (CSW) will continue to follow and assist as needed.   Santiago Glad met with patient's mother Ryan Howe to discuss the financial details for patient going to [place] facility. Per Santiago Glad mother has agreed to pay and plan is for patient to go to [place] facility, likely tomorrow.    [employer], LCSW [phone]

## 2016-05-03 NOTE — Progress Notes (Signed)
Hospice home referral received from [facility]. Mr. Ryan Howe was referred on 1/26 and due to [facility] being out of network the referral was rescinded. Patient [facility] [place], however, that bed offer is no longer available. Writer had several conversations with CSW and Financial plannerinsurance verification specialist [facility]. This information was discussed with patient's mother Ryan Howe in the room, she has agreed to the out of pocket charges and wishes for her son to transfer to the hospice home. Mrs. Ryan Howe and hospital care team all aware of plan  for transfer to the Hospice home tomorrow 1/30.  Patient appears jaundice with increased respiratory rate, skin very warm to touch. He received IV morphine 1 mg at 1:42 pm today. Staff RN Ryan Howe notified of increased respiratory rate. Updated patient information faxed to hospice referral. Will continue to follow through final disposition. Ryan BarkerKaren Robertson RN, BSN, [facility] [phone] c

## 2016-05-03 NOTE — Progress Notes (Addendum)
MEDICATION RELATED CONSULT NOTE   Pharmacy Consult for electrolyte monitoring   Pharmacy consulted for electrolyte management for 56 yo male critical care patient s/p extubation for acute liver failure. Patient receiving lactulose 30g PO BID.   Plan:   1/28 AM K = 2.8.  MD ordered KCl 30meq IV x 1 dose and KCl 40meq packet PO BID.  Will recheck K level today at 12:00.    1/28 12:16 K = 2.9. Ordered another bag of KCl 30meq IV.  Will recheck K level tonight at 19:00.   1/29 K 3.4 will continue with Kcl 40 mEq packet BID. 1/29 Ca 7.2 will give calcium gluconate 2g IV x 1.  Per MD no labs for am. Pt has been transferred to inpatient hospice and [facility]  No Known Allergies  Patient Measurements: Height: 6' (182.9 cm) Weight: 200 lb (90.7 kg) IBW/kg (Calculated) : 77.6  Vital Signs: Temp: 97.8 F (36.6 C) (01/28 1216) Temp Source: Axillary (01/28 1216) BP: 110/62 (01/28 1216) Pulse Rate: 103 (01/28 1216) Intake/Output from previous day: 01/27 0701 - 01/28 0700 In: 1238.3 [P.O.:240; I.V.:683.3; IV Piggyback:315] Out: 600 [Urine:600] Intake/Output from this shift: Total I/O In: 120 [P.O.:120] Out: 200 [Urine:200]  Labs:  Recent Labs (last 2 labs)    Recent Labs  04/30/16 0430 05/01/16 0448 05/02/16 0314  WBC  --  11.7*  --   HGB  --  7.6*  --   HCT  --  21.9*  --   PLT  --  126*  --   CREATININE  UNABLE TO REPORT DUE TO ICTERUS UNABLE TO REPORT DUE TO ICTERUS SEE COMMENTS  MG 2.2 2.0 1.9  PHOS  UNABLE TO REPORT DUE TO ICTERUS UNABLE TO REPORT DUE TO ICTERUS SEE COMMENTS  ALBUMIN  --  2.1*  --   PROT  --  6.3*  --   AST  --  176*  --   ALT  --  43  --   ALKPHOS  --  129*  --   BILITOT  --  29.3*  --      CrCl cannot be calculated (This lab value cannot be used to calculate CrCl because it is not a number: SEE COMMENTS).      Pharmacy will continue to monitor and adjust per consult.   Thank you for this consult.  Thomasene Rippleavid  [place], PharmD, BCPS Clinical Pharmacist 05/03/2016

## 2016-05-03 NOTE — Progress Notes (Signed)
Clinical Child psychotherapistocial Worker (CSW) contacted [facility] facility today to check patient's status on the waiting list. [facility] will check and call CSW back.   Baker Hughes IncorporatedBailey Karanvir Balderston, LCSW [phone]

## 2016-05-04 LAB — BASIC METABOLIC PANEL
Anion gap: 9 (ref 5–15)
BUN: 16 mg/dL (ref 6–20)
CHLORIDE: 106 mmol/L (ref 101–111)
CO2: 21 mmol/L — ABNORMAL LOW (ref 22–32)
CREATININE: UNDETERMINED mg/dL (ref 0.61–1.24)
Calcium: 7.6 mg/dL — ABNORMAL LOW (ref 8.9–10.3)
Glucose, Bld: 104 mg/dL — ABNORMAL HIGH (ref 65–99)
POTASSIUM: 3.5 mmol/L (ref 3.5–5.1)
SODIUM: 136 mmol/L (ref 135–145)

## 2016-05-04 LAB — PHOSPHORUS: PHOSPHORUS: UNDETERMINED mg/dL (ref 2.5–4.6)

## 2016-05-04 LAB — MAGNESIUM: MAGNESIUM: 1.7 mg/dL (ref 1.7–2.4)

## 2016-05-04 MED ORDER — MORPHINE SULFATE (CONCENTRATE) 20 MG/ML PO SOLN: 10.0000 mg | ORAL | 0 refills | Status: AC | PRN

## 2016-05-04 MED ORDER — LORAZEPAM 0.5 MG PO TABS: 0.5000 mg | ORAL_TABLET | ORAL | 0 refills | Status: AC | PRN

## 2016-05-04 NOTE — Progress Notes (Signed)
Patient alert and oriented, not complaining of any pain. Vital signs stable. Patient skin jaundice and yellow per baseline. Some scabbing on scalp, RN used ointment prescribed.EMS here for transport to Hospice Home. Clydie BraunKaren, palliative nurse gave facility report and contacted EMS. Patient being transported on 4L O2.   Harvie HeckMelanie Kadar Chance, RN

## 2016-05-04 NOTE — Progress Notes (Signed)
Follow up visit to new hospice home referral. Patient seen lying in bed, alert, able to converse and answer questions.  He is aware of the plan to discharge to the hospice home. Patient's mother Ryan Howe at bedside. Questions answered, consents signed. Discharge summary faxed to referral. Report called to the hospice home, EMS notified for transport. Family and hospital care team all aware. Signed portable DNR in place in discharge packet. Thank you. Dayna BarkerKaren Robertson RN, BSN, [facility], hospital Liaison [phone] c

## 2016-05-04 NOTE — Discharge Summary (Signed)
[facility] [place] [facility]   PATIENT NAME: Ryan Howe    MR#:  [id number]  DATE OF BIRTH:  June 04, 1960  DATE OF ADMISSION:  04/27/2016 ADMITTING PHYSICIAN: Shaune PollackQing Chen, MD  DATE OF DISCHARGE: 05/04/2016  PRIMARY CARE PHYSICIAN: [facility]   ADMISSION DIAGNOSIS:  Hyperbilirubinemia [E80.6] Hyperammonemia (HCC) [E72.20] Elevated lactic acid level [R79.89] Decompensation of cirrhosis of liver (HCC) [K72.90] Acute liver failure without hepatic coma [K72.00]  DISCHARGE DIAGNOSIS:  Active Problems:   Acute liver failure without hepatic coma   Acute respiratory failure with hypoxia (HCC)   Palliative care by specialist   DNR (do not resuscitate)   Goals of care, counseling/discussion   Hyperammonemia (HCC)   Encounter for hospice care discussion   SECONDARY DIAGNOSIS:   Past Medical History:  Diagnosis Date  . Anemia of chronic disease   . Anxiety   . AVNRT (AV nodal re-entry tachycardia) (HCC)   . Cirrhosis (HCC)   . GERD (gastroesophageal reflux disease)   . Hypercholesteremia   . Hypertension   . Liver failure (HCC)   . Numbness of toes   . Pancreatitis   . Polysubstance abuse    a. former cocaine and tobacco abuse, ongoing alcohol and marijuana abuse  . Thrombocytopenia (HCC)      ADMITTING HISTORY  HISTORY OF PRESENT ILLNESS:  Ryan BirchwoodMichael Lope  is a 56 y.o. male with a known history of Chronic rectal bleeding, alcohol abuse, liver cirrhosis, liver failure, hypertension, hyperlipidemia and pancreatitis. The patient presented to the ED with rectal bleeding and jaundice. He has chronic rectal bleeding every other day but worsening for the past few days. He feels like his abdomen is distended but no abdominal pain. He has jaundice but worsening now. He also has dark urine. He denies any fever or chills, no nausea or vomiting but had diarrhea due to lactulose. He still drinks alcohol every day (at least 8 ounces of whiskey a day  and probably 6 beers). He has tachycardia, tachypnea and leukocytosis. He is found to liver failure with bilirubin at 31.3.    HOSPITAL COURSE:   56 year old male with history of liver cirrhosis and long-standing EtOH abuse who is admitted for severe alcoholic hepatitis  1. Severe alcoholic hepatitis 2. EtOH abuse 3. Rectal bleeding: Chronic 4. Acute hypoxic respiratory failure 5. Hypokalemia * . Hypocalcemia * acute hepatic encephalopathy * . Anemia of chronic disease * thrombocytopenia  Patient was admitted to the hospitalist service. Due to ongoing respiratory distress patient had to be placed on full ventilatory support after intubation. Pulmonary critical care medicine was consulted. Patient eventually self extubated. In spite of aggressive treatment patient did not improve well. Had worsening liver failure due to alcohol abuse. On discussing with patient and his mother patient expressed that he did not want any further treatment. At this point they have decided to pursue comfort measures. Patient was placed on as needed morphine and Ativan. He seems more comfortable at this point. Will be transferred to hospice home for end-of-life care.   CONSULTS OBTAINED:    DRUG ALLERGIES:  No Known Allergies  DISCHARGE MEDICATIONS:   Current Discharge Medication List    START taking these medications   Details  LORazepam (ATIVAN) 0.5 MG tablet Take 1 tablet (0.5 mg total) by mouth every 2 (two) hours as needed for anxiety. Qty: 30 tablet, Refills: 0    morphine (ROXANOL) 20 MG/ML concentrated solution Take 0.5 mLs (10 mg total) by mouth every 2 (two)  hours as needed for severe pain or shortness of breath. Refills: 0      STOP taking these medications     citalopram (CELEXA) 10 MG tablet      feeding supplement, ENSURE ENLIVE, (ENSURE ENLIVE) LIQD      ferrous sulfate 325 (65 FE) MG tablet      folic acid (FOLVITE) 1 MG tablet      furosemide (LASIX) 40 MG tablet       lactulose (CHRONULAC) 10 GM/15ML solution      MELATONIN PO      metoprolol tartrate (LOPRESSOR) 25 MG tablet      Multiple Vitamin (MULTIVITAMIN WITH MINERALS) TABS tablet      pantoprazole (PROTONIX) 40 MG tablet      potassium chloride 20 MEQ TBCR      spironolactone (ALDACTONE) 50 MG tablet      polyethylene glycol (GOLYTELY) 236 g solution      prednisoLONE (ORAPRED) 15 MG/5ML solution         Today   VITAL SIGNS:  Blood pressure 118/60, pulse 90, temperature 99.8 F (37.7 C), temperature source Oral, resp. rate (!) 26, height 6' (1.829 m), weight 94.2 kg (207 lb 11.2 oz), SpO2 94 %.  I/O:   Intake/Output Summary (Last 24 hours) at 05/04/16 0809 Last data filed at 05/03/16 2120  Gross per 24 hour  Intake              603 ml  Output              301 ml  Net              302 ml    PHYSICAL EXAMINATION:  Physical Exam  GENERAL:  56 y.o.-year-old patient lying , jaundiced LUNGS: Shallow, tachypneic CARDIOVASCULAR: S1, S2 ABDOMEN: Distended  PSYCHIATRIC: The patient is drowzy  DATA REVIEW:   CBC  Recent Labs Lab 05/01/16 0448  WBC 11.7*  HGB 7.6*  HCT 21.9*  PLT 126*    Chemistries   Recent Labs Lab 05/01/16 0448  05/03/16 0324  NA 144  < > 143  K 3.0*  < > 3.4*  CL 105  < > 111  CO2 33*  < > 26  GLUCOSE 101*  < > 97  BUN 25*  < > 18  CREATININE UNABLE TO REPORT DUE TO ICTERUS  < > SEE COMMENTS  CALCIUM 7.1*  < > 7.2*  MG 2.0  < > 1.8  AST 176*  --   --   ALT 43  --   --   ALKPHOS 129*  --   --   BILITOT 29.3*  --   --   < > = values in this interval not displayed.  Cardiac Enzymes No results for input(s): TROPONINI in the last 168 hours.  Microbiology Results  Results for orders placed or performed during the hospital encounter of 04/27/16  Culture, blood (x 2)     Status: None   Collection Time: 04/27/16  9:27 PM  Result Value Ref Range Status   Specimen Description BLOOD LEFT [facility]  Final   Special Requests BOTTLES DRAWN AEROBIC  AND ANAEROBIC 8CC  Final   Culture NO GROWTH 5 DAYS  Final   Report Status 05/02/2016 FINAL  Final  Culture, blood (x 2)     Status: None   Collection Time: 04/27/16  9:27 PM  Result Value Ref Range Status   Specimen Description BLOOD RIGHT HAND  Final   Special  Requests BOTTLES DRAWN AEROBIC AND ANAEROBIC AER8CC ANA10CC  Final   Culture NO GROWTH 5 DAYS  Final   Report Status 05/02/2016 FINAL  Final  MRSA PCR Screening     Status: None   Collection Time: 04/28/16  1:38 PM  Result Value Ref Range Status   MRSA by PCR NEGATIVE NEGATIVE Final    Comment:        The GeneXpert MRSA Assay (FDA approved for NASAL specimens only), is one component of a comprehensive MRSA colonization surveillance program. It is not intended to diagnose MRSA infection nor to guide or monitor treatment for MRSA infections.     RADIOLOGY:  No results found.  Follow up with PCP in 1 week.  Management plans discussed with the patient, family and they are in agreement.  CODE STATUS:     Code Status Orders        Start     Ordered   04/29/16 1220  Do not attempt resuscitation (DNR)  Continuous    Question Answer Comment  In the event of cardiac or respiratory ARREST Do not call a "code blue"   In the event of cardiac or respiratory ARREST Do not perform Intubation, CPR, defibrillation or ACLS   In the event of cardiac or respiratory ARREST Use medication by any route, position, wound care, and other measures to relive pain and suffering. May use oxygen, suction and manual treatment of airway obstruction as needed for comfort.      04/29/16 1219    Code Status History    Date Active Date Inactive Code Status Order ID Comments User Context   04/27/2016  9:16 PM 04/29/2016 12:19 PM Full Code [id number]  Shaune Pollack, MD Inpatient   01/11/2016 11:21 PM 01/13/2016  7:27 PM Full Code [id number]  Oralia Manis, MD Inpatient   12/22/2015  8:31 PM 12/26/2015  6:53 PM Full Code [id number]  Milagros Loll, MD ED    12/22/2015  4:27 PM 12/22/2015  8:30 PM Full Code [id number]  Willy Eddy, MD ED    Advance Directive Documentation   Flowsheet Row Most Recent Value  Type of Advance Directive  Healthcare Power of Attorney, Living will  Pre-existing out of facility DNR order (yellow form or pink MOST form)  No data  "MOST" Form in Place?  No data      TOTAL TIME TAKING CARE OF THIS PATIENT ON DAY OF DISCHARGE: more than 30 minutes.   Milagros Loll R M.D on 05/04/2016 at 8:09 AM  Between 7am to 6pm - Pager - [phone]  After 6pm go to [email or web address] - password [email or web address]  [facility] [place] [facility]  Office  [phone]  CC: Primary care physician; [facility]  Note: This dictation was prepared with Dragon dictation along with smaller phrase technology. Any transcriptional errors that result from this process are unintentional.

## 2016-05-04 NOTE — Progress Notes (Signed)
Per Clydie BraunKaren Mentone/ [facility] liaison they have a bed for patient today at [facility]. Clinical Child psychotherapistocial Worker (CSW) prepared EMS packet and made patient aware of above. RN aware of above. Please reconsult if future social work needs arise. CSW signing off.   Baker Hughes IncorporatedBailey Little Bashore, LCSW [phone]

## 2016-06-03 DEATH — deceased

## 2017-03-25 IMAGING — DX DG ABDOMEN 1V
1 series · 1 of 1 positions shown · non-contrast
Comparison: Portable chest from today reported separately. Abdomen
series [id number].

CLINICAL DATA: 55-year-old male intubated and line placement.
Initial encounter.

EXAM:
ABDOMEN - 1 VIEW

[abdomen kub]
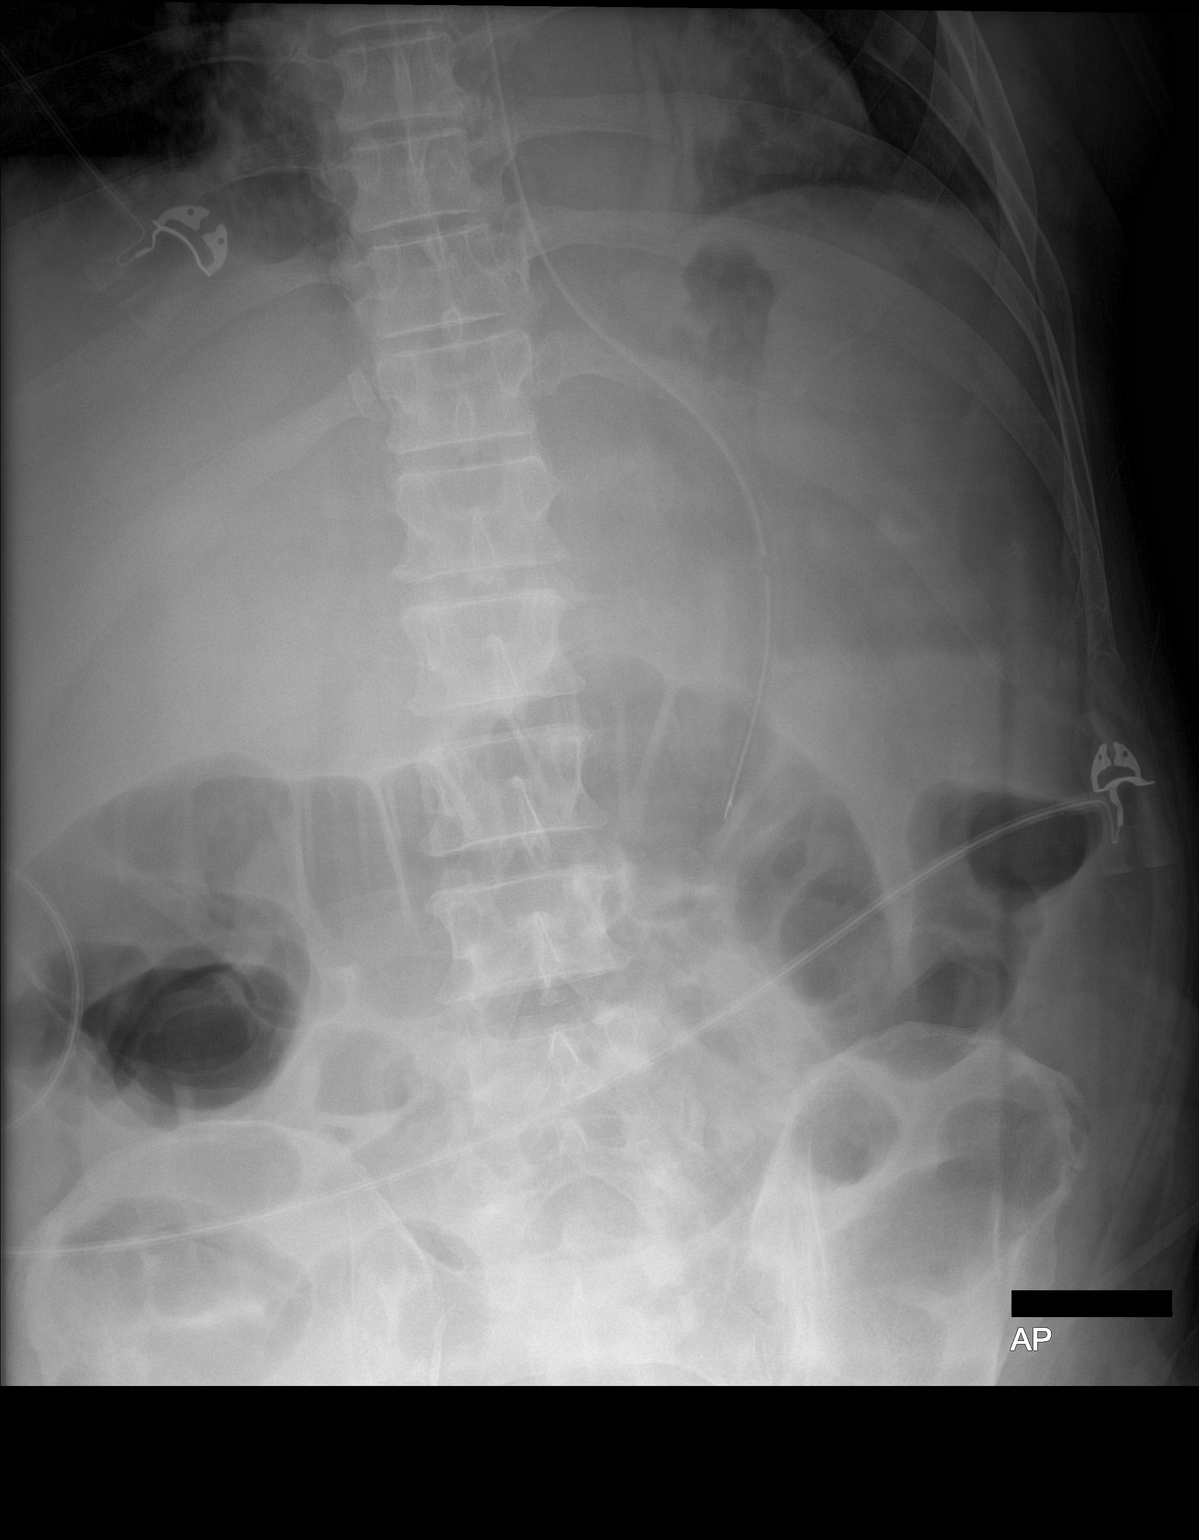

[1 of 1 positions shown; findings below may reference images not displayed]

FINDINGS: Portable AP view at 0531 hours. Enteric tube courses to the left
upper quadrant in the side holes at the level of the gastric body.

There is a central vascular catheter projecting over the right
sacrum. Increased gas in small and large bowel loops in the mid
abdomen, but the pattern remains non obstructed.

Left lung base confluent opacity re- demonstrated. No acute osseous
abnormality identified.
IMPRESSION: 1. Enteric tube side hole up the level of the gastric body. Central
vascular catheter projects over the right sacrum.
2. Increased bowel gas but nonobstructed pattern at this time.

## 2017-03-25 IMAGING — DX DG CHEST 1V
1 series · 1 of 1 positions shown · non-contrast
Comparison: 01/11/2016.

CLINICAL DATA: 55-year-old male intubated and line placement.
Initial encounter.

EXAM:
CHEST 1 VIEW

[chest ap]
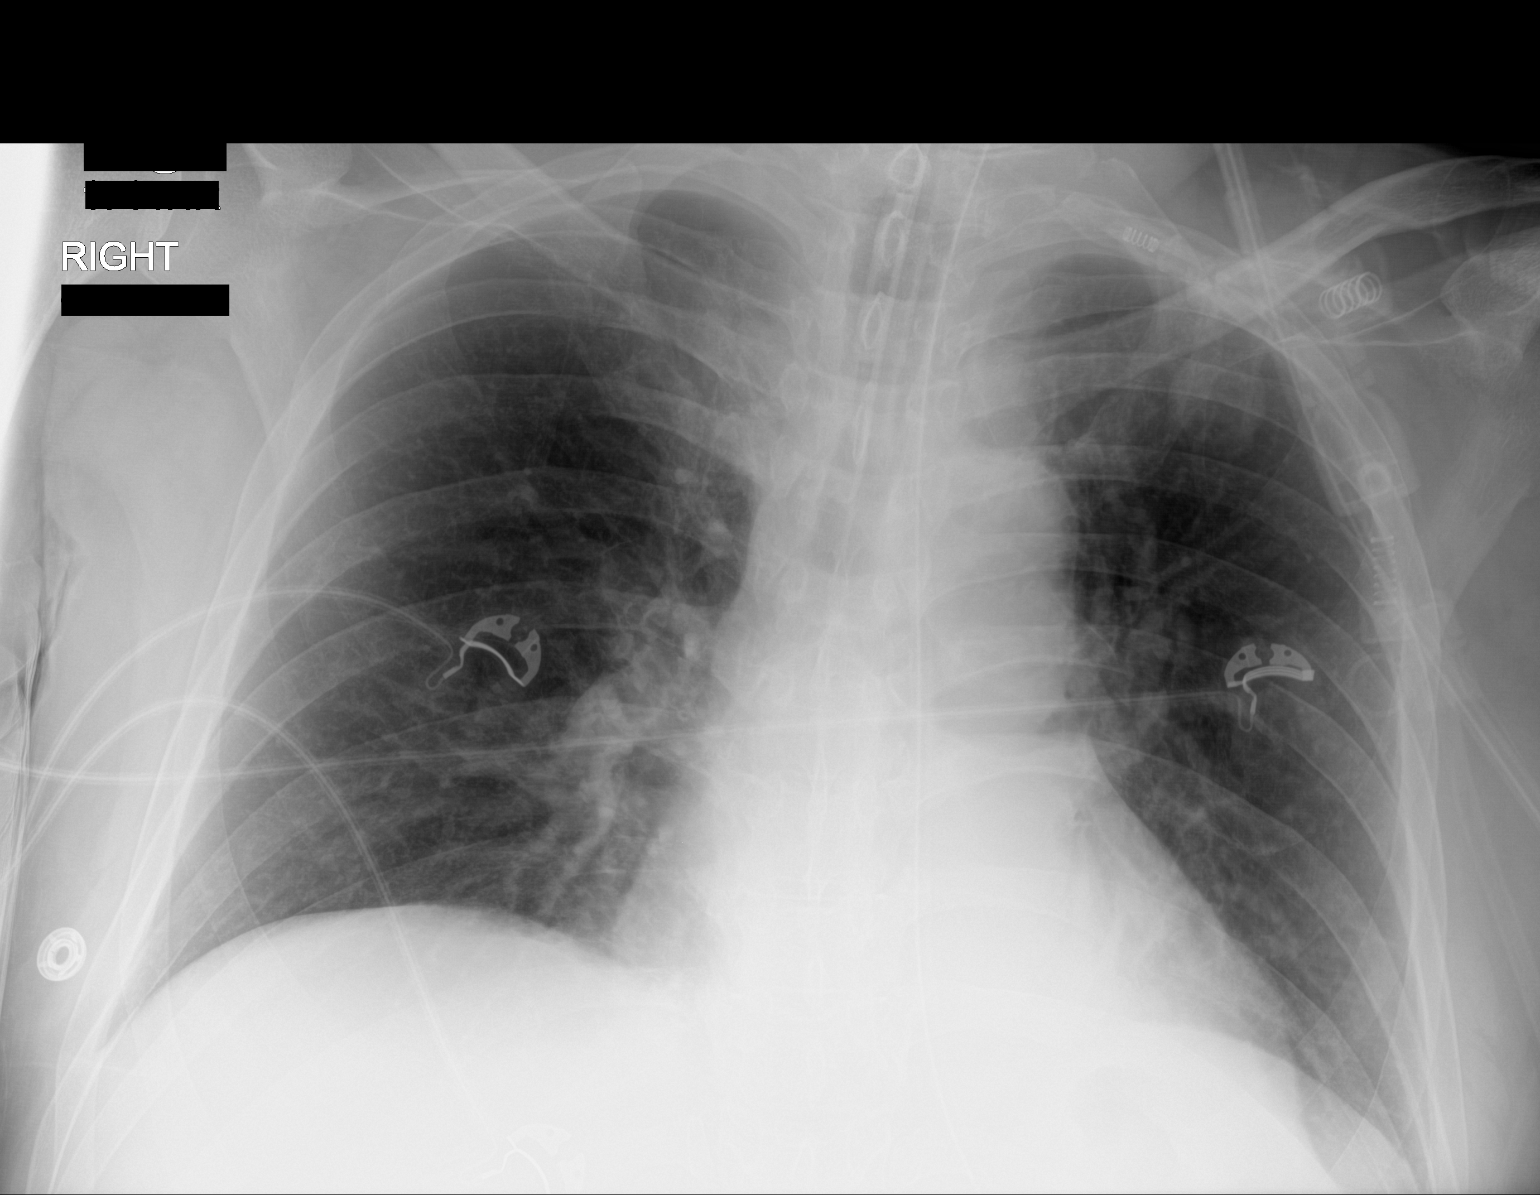

[1 of 1 positions shown; findings below may reference images not displayed]

FINDINGS: Portable AP semi upright view at 8083 hours. Intubated, endotracheal
tube tip about 2.5 cm above the carina. Enteric tube in place,
courses to the left upper quadrant with tip not included.

Mildly lower lung volumes. Dense partial retrocardiac opacity along
the medial left hemidiaphragm. Otherwise when Allowing for portable
technique the lungs are clear. No pneumothorax. Stable cardiac size
and mediastinal contours.
IMPRESSION: 1. Endotracheal tube in good position. Enteric tube courses to the
abdomen.
2. Left lower lobe atelectasis or consolidation. No other acute
cardiopulmonary abnormality.

## 2018-05-14 IMAGING — US US ABDOMEN LIMITED
1 series · 14 of 25 positions shown · non-contrast
Comparison: 12/23/2015

CLINICAL DATA: Cirrhosis, decompensation

EXAM:
US ABDOMEN LIMITED - RIGHT UPPER QUADRANT

[Series 1: us abdomen limited · 0.26mm/px · 14 of 56 slices shown]
[im 1/56]
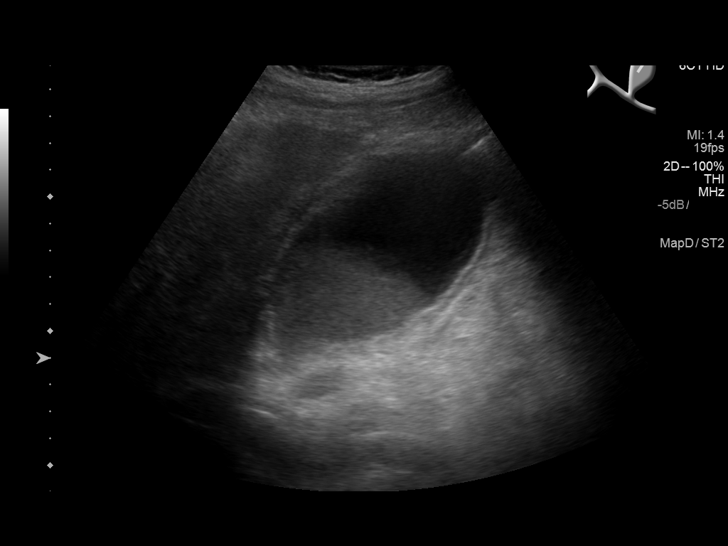
[im 5/56]
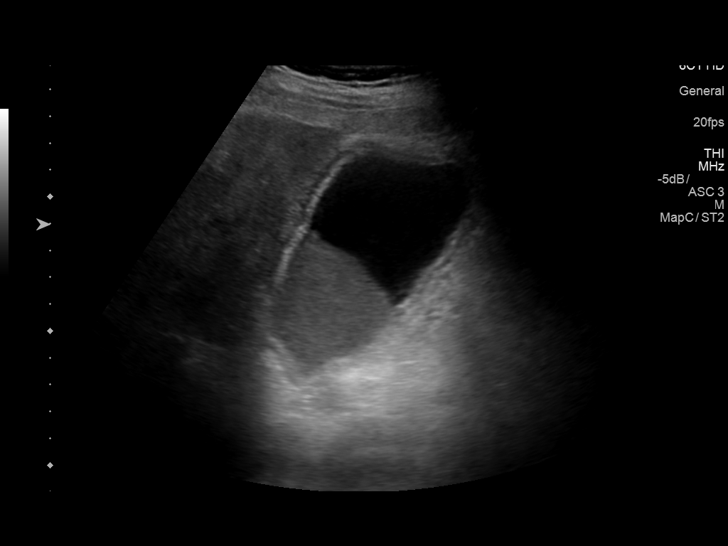
[im 10/56]
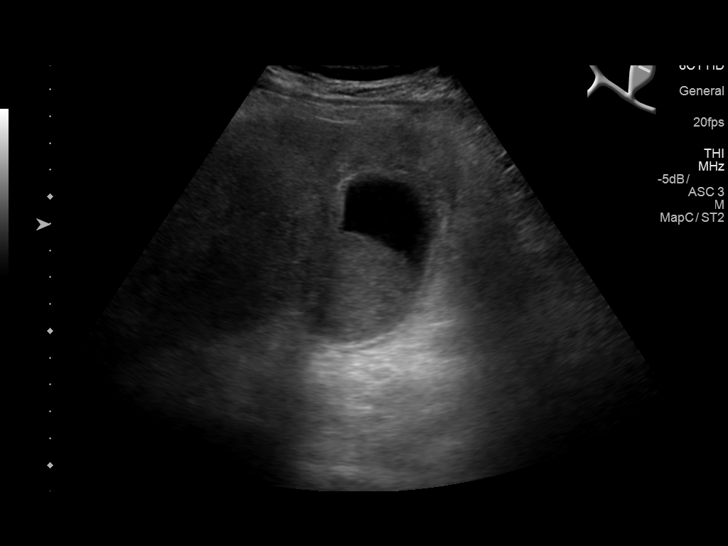
[im 14/56]
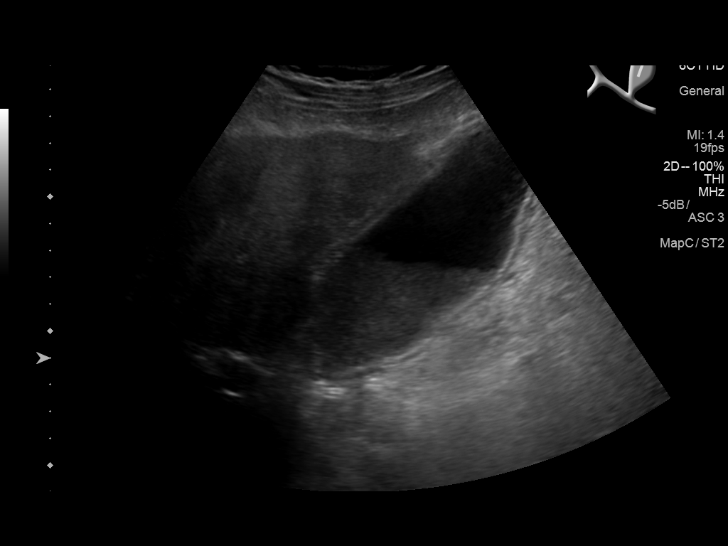
[im 19/56]
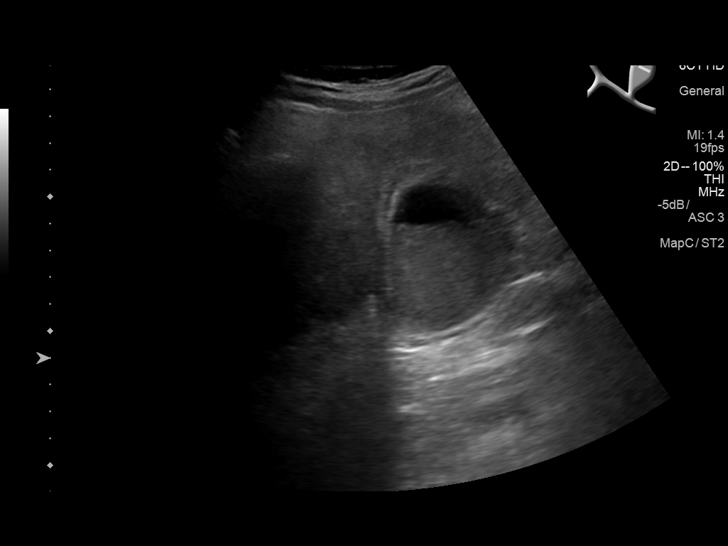
[im 21/56]
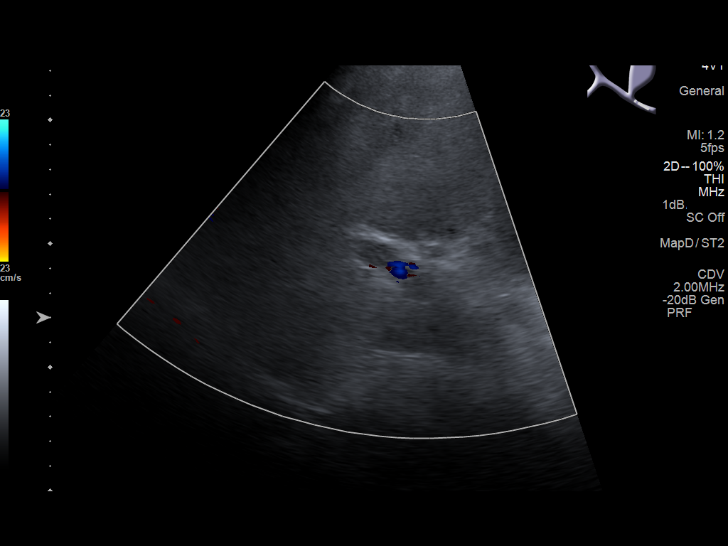
[im 26/56]
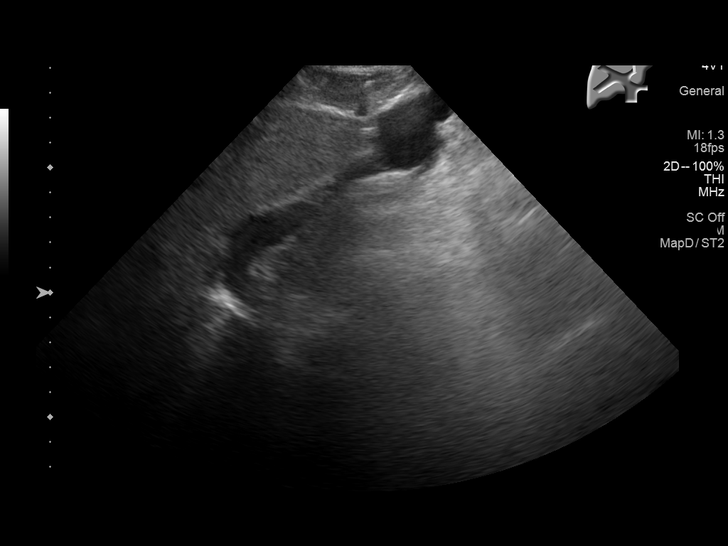
[im 30/56]
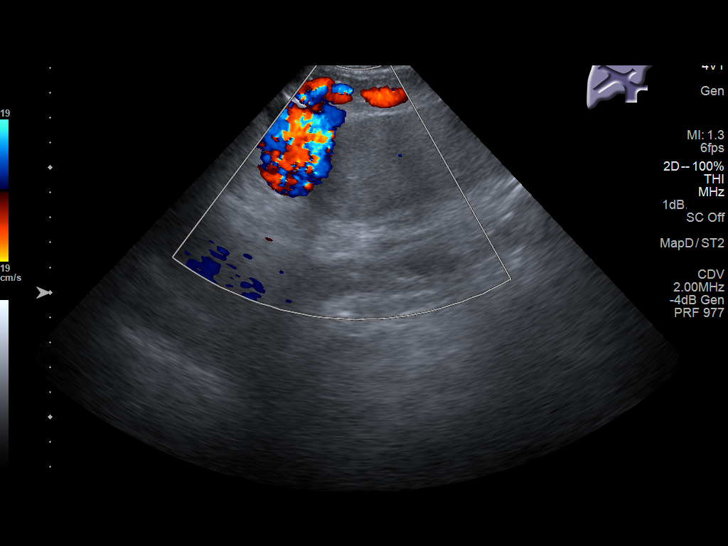
[im 35/56]
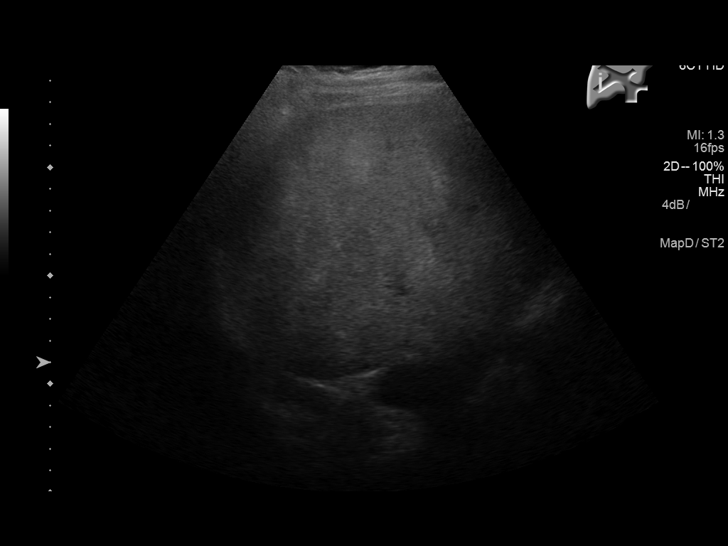
[im 37/56]
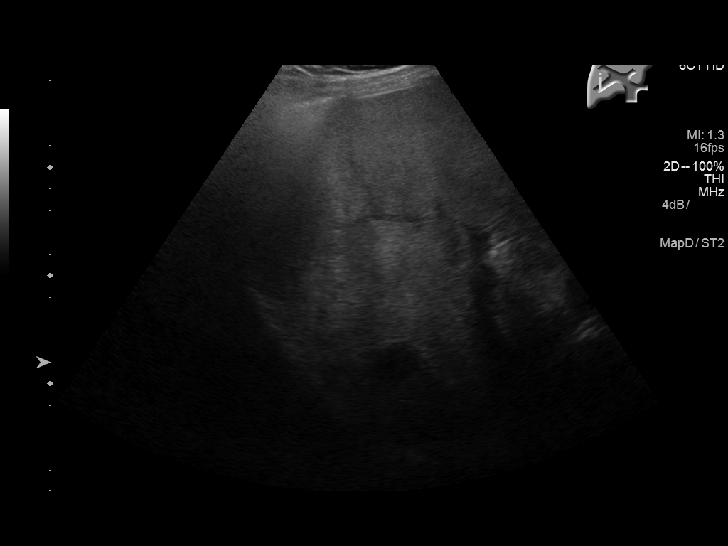
[im 42/56]
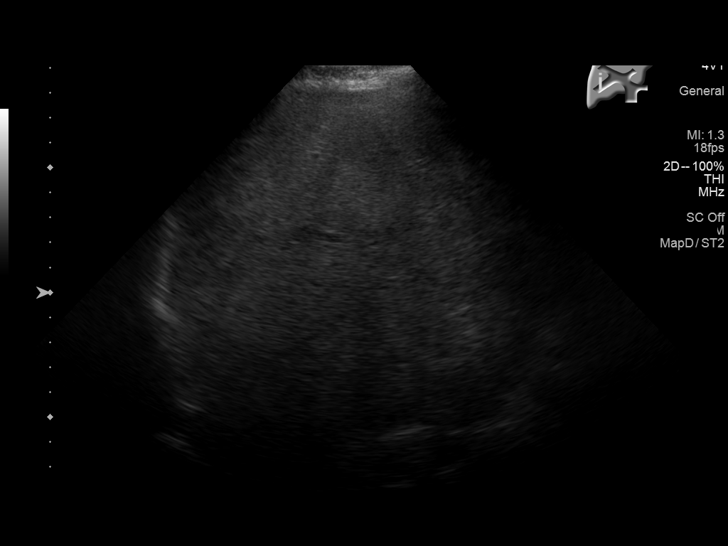
[im 46/56]
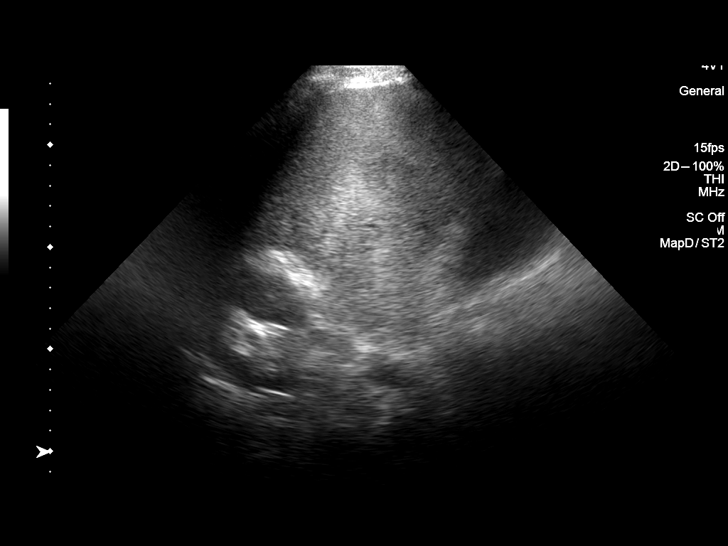
[im 51/56]
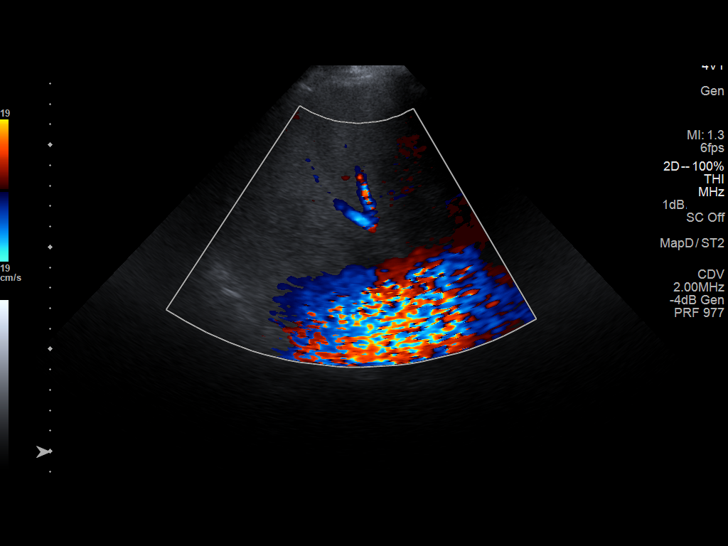
[im 56/56]
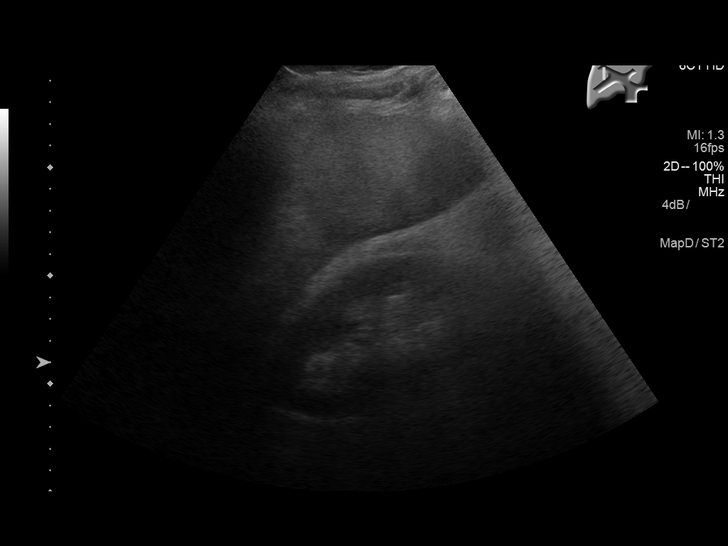

[14 of 25 positions shown; findings below may reference images not displayed]

FINDINGS: Gallbladder:

Sludge noted within the gallbladder. There is gallbladder wall
thickening measuring 5 mm. Negative sonographic Eskandar.

Common bile duct:

Diameter: Normal caliber, 5-6 mm.

Liver:

Heterogeneous, increased echotexture throughout the liver with
nodular contours compatible with patient's given history of
cirrhosis. No focal abnormality or biliary duct dilatation.
IMPRESSION: Changes of cirrhosis.  No focal hepatic abnormality.

Gallbladder sludge with gallbladder wall thickening, likely related
to liver disease.
